# Patient Record
Sex: Female | Born: 1970 | Marital: Married
Health system: Western US, Academic
[De-identification: ages and names within clinical notes are randomized; demographics above are authoritative.]

---

## 2000-10-16 ENCOUNTER — Ambulatory Visit: Payer: Self-pay

## 2000-11-08 ENCOUNTER — Other Ambulatory Visit: Payer: Self-pay | Admitting: OBSTETRICS/GYN

## 2010-08-19 NOTE — Discharge Summary (Signed)
SERVICE: OB/GYN    DISCHARGE DIAGNOSES:    1. Vaginal birth after cesarean section.  2. Positive meconium, group B Streptococcus positive.  3. Status post bilateral tubal ligation.    HISTORY OF PRESENT ILLNESS:    This is a 40 year old G5, P4-0-0-4 with an EGA of 41 and 2 weeks, EDC of  10/30/00 confirmed by a 37 weeks ultrasound on 04/30. The patient  initially presented to Labor and Delivery with increased frequency of  uterine contractions and loss of fluid. No vaginal bleeding with positive  fetal movements, and no other complaints.    PAST MEDICAL HISTORY:    No medical or surgical history: Past medications: Prenatal vitamins.  ALLERGIES: NO KNOWN DRUG ALLERGIES.    OBSTETRICAL HISTORY:    Presented late to care at 34 weeks. She had a previous NSVD in 1991, 1993,  and 1994 and a primary C-section in 2000. These were all done in [place].    SOCIAL HISTORY: She has no drug use, no alcohol, and no tobacco.  FAMILY HISTORY: None.    INITIAL PHYSICAL EXAM:    Normocephalic, atraumatic. Heart was regular rate and rhythm. Lungs were  clear to auscultation. The fundus was term. Estimated fetal weight was 7  pounds. Fetal heart tracings were in the 130s. Initial exam showed  dilation of 4 cm, effacement of 80%, station was -2, membranes were intact.    MATERNAL LABS:    Blood type O+, antibody screen negative, hep B SAG nonreactive, rubella  immune. Hematocrit 35.9, postop hematocrit was 30.4, GBS was positive.  One-hour GTT was 95. RPR was nonreactive.    HOSPITAL COURSE:    The patient was admitted and consented for a vaginal birth after C-section.  The patient was AROM'd with intrauterine monitoring, and on 11/08/00 at  12:18, the patient delivered via a normal spontaneous vaginal delivery a  viable infant with Apgars 8 and 8. There was some difficulty delivering  the anterior shoulder requiring McRoberts maneuver and suprapubic pressure.  The infant also had thin meconium and GBS positive, so ampicillin was  given.  The patient was taken over to Postpartum, and her postpartum course  was essentially unremarkable. On postpartum day one, the patient had a  bilateral tubal ligation without any complications. On postpartum day #2,  the patient was ambulating and voiding and taking good p.o.'s with minimal  vaginal bleeding and pain at the time of discharge.    DISCHARGE MEDICATIONS:    Motrin 600 mg p.o. t.i.d.; Colace 100 mg p.o. b.i.d.; iron p.o. b.i.d.    FOLLOWUP: [facility] weeks.      ACTIVITY:    The patient is instructed to have nothing in the vagina for four to six  weeks (no tampons, douching or sex). In addition, if the patient  experiences a temperature or vaginal bleeding greater than one pad per hour  for three or four hours, the patient is instructed to call an M.D. or  return to the ER, and all of this was reviewed with the help of an  interpreter, as the patient speaks Spanish only.    CONDITION ON DISCHARGE: Stable.        DICTATED BY:  Nicolasa Ducking, M.D.  [facility]  [id number]    Signature and date on the line below this statement indicates that I have  made the corrections/completed the blanks/added the handwritten information  on the date entered below.      Signature/Author of ChangesDate of  Changes    D: 11/10/2000 12:21 P  T: 11/13/2000 5:18 P    C#: [id number]

## 2012-06-14 IMAGING — MG MAMMO SCRN BIL W/CAD
4 series · 4 of 4 positions shown · non-contrast
Comparison: none

Images Obtained from [HOSPITAL] Imaging
CLINICAL RA REF: Mammo Scrn Bil (Digital) W/ Cad Routine screening. Mother with postmenopausal breast cancer.
Comparison is made to exams dated:  03/23/2011 [HOSPITAL] [facility], 02/17/2010, and 09/02/2008 [HOSPITAL] [facility].
There are scattered fibroglandular elements in both breasts.
Current study was also evaluated with a Computer Aided Detection (CAD) system.
There are benign calcifications in the right breast.
No significant masses, calcifications, or other findings are seen in either breast.
There has been no significant interval change.

[R CC]
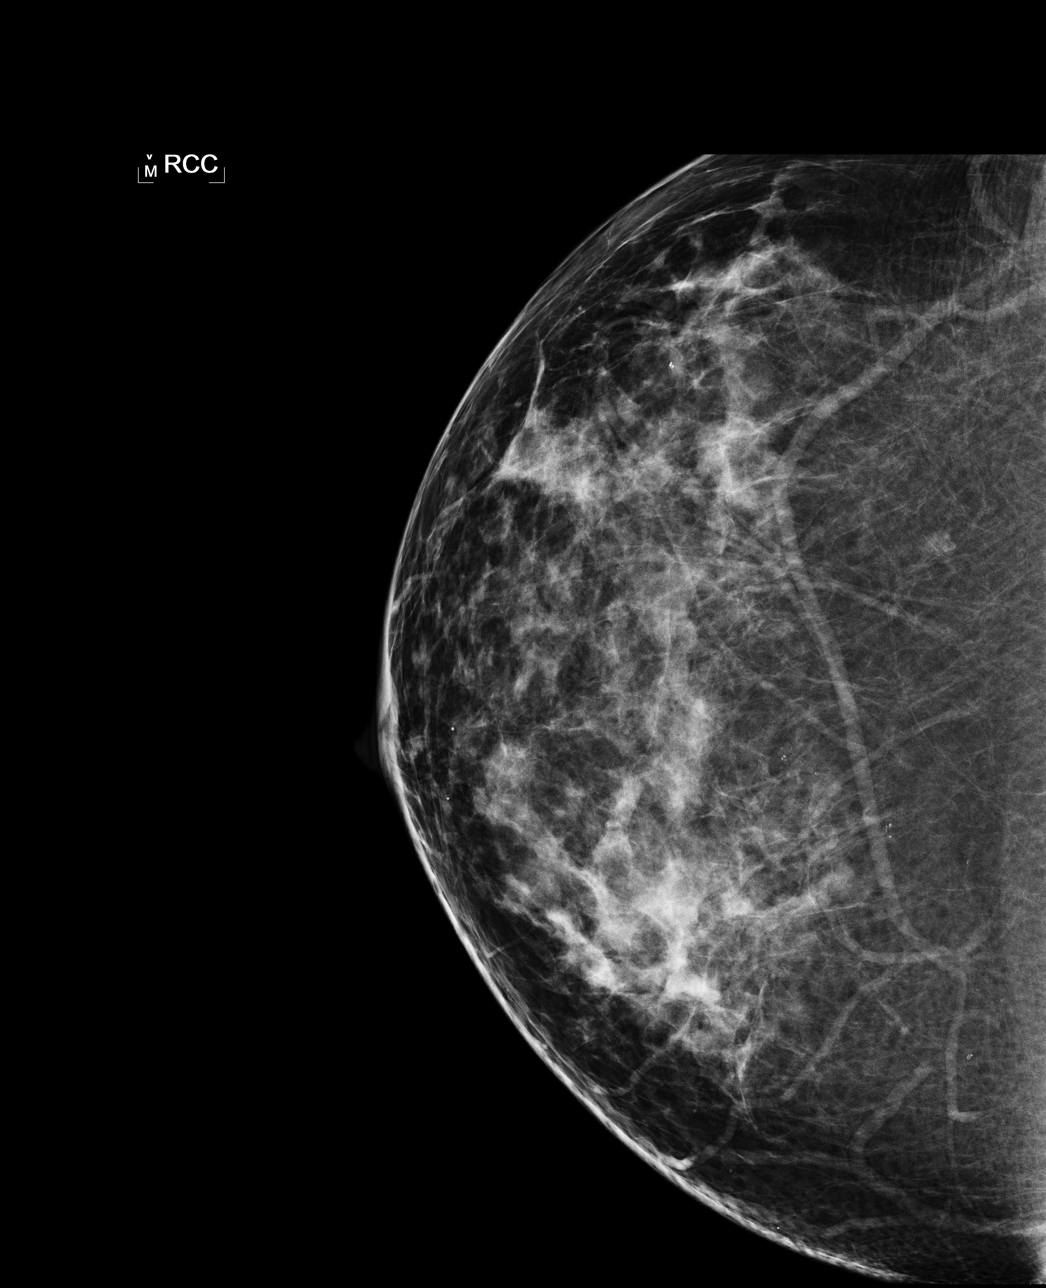

[L CC]
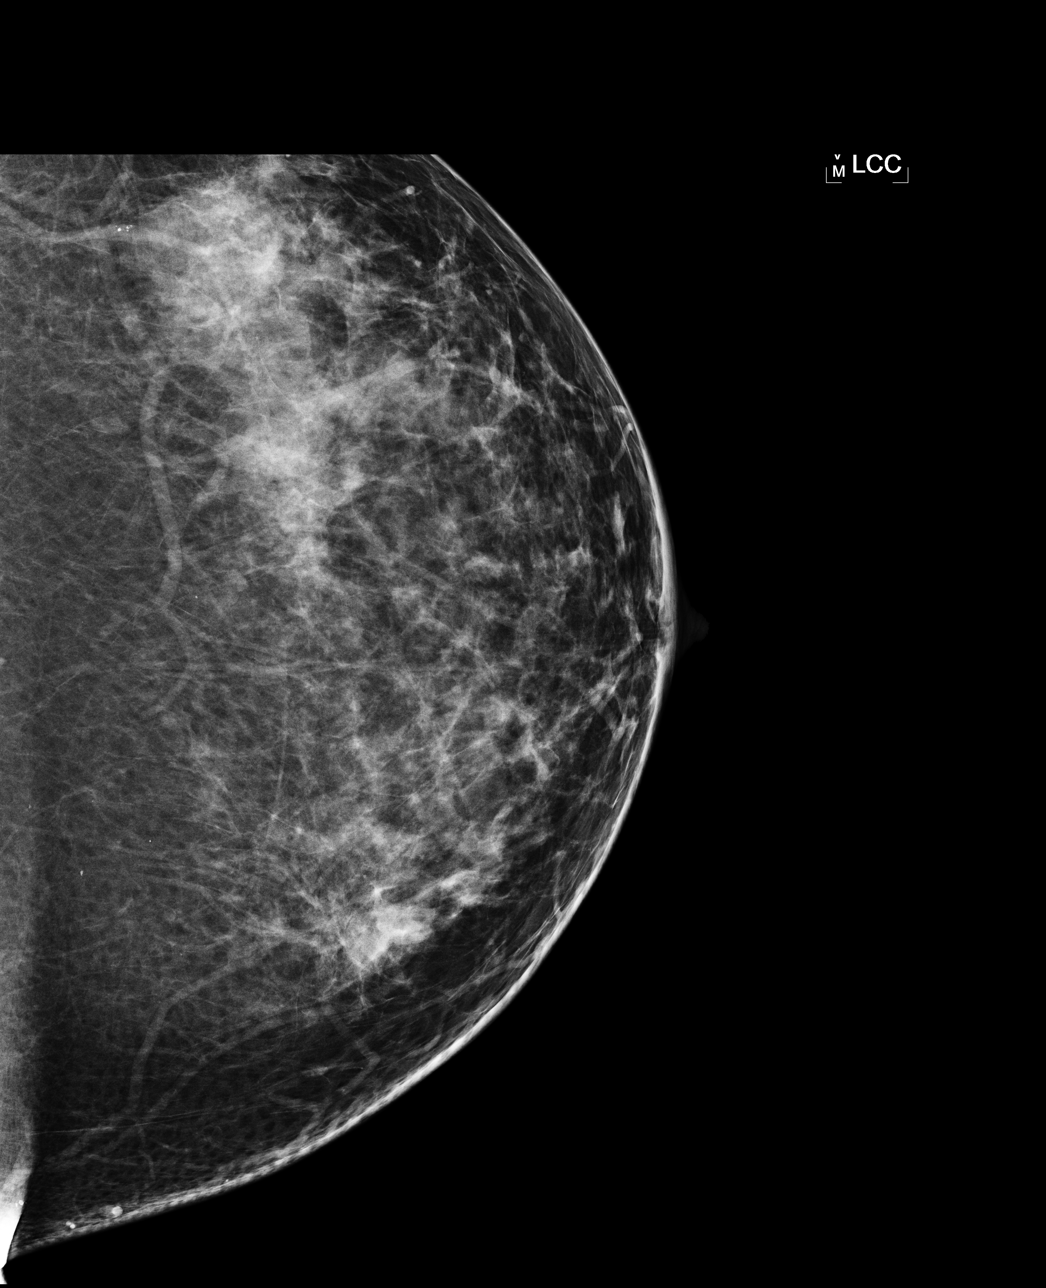

[R MLO]
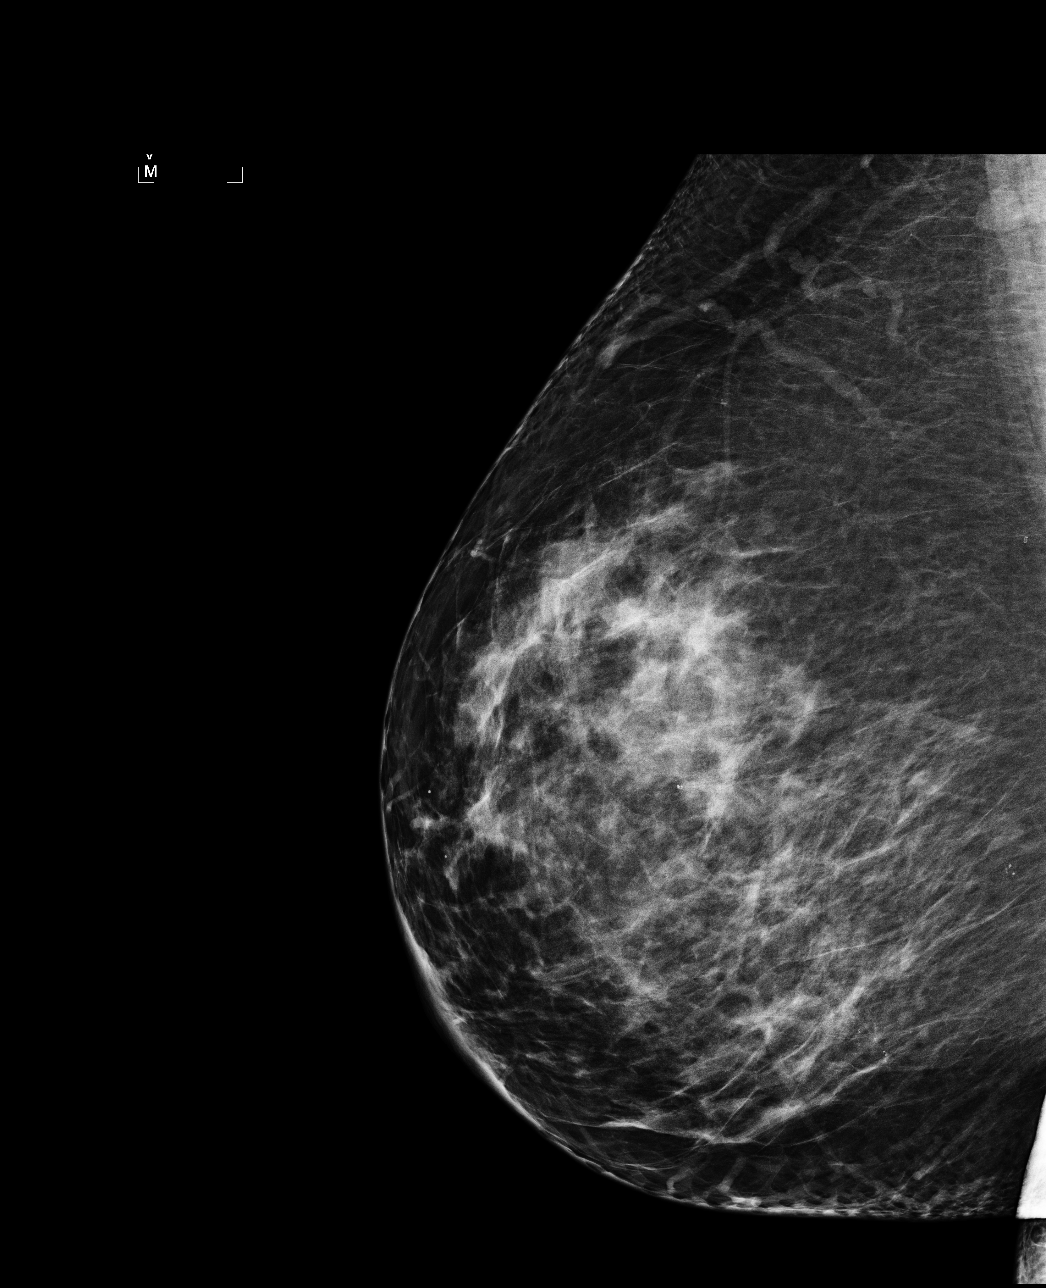

[L MLO]
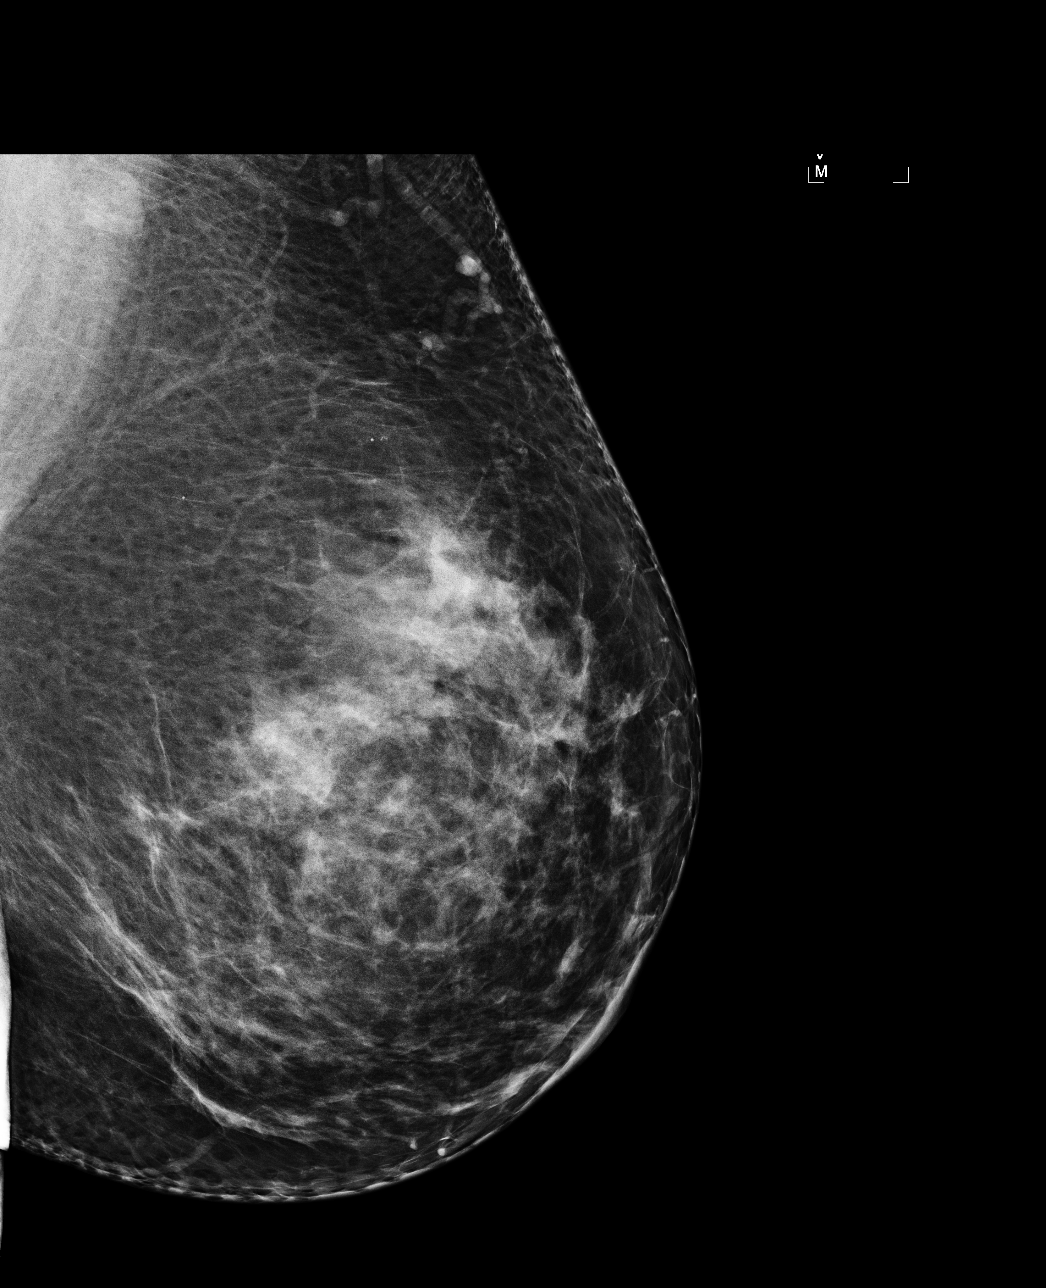

[4 of 4 positions shown; findings below may reference images not displayed]

IMPRESSION: There is no mammographic evidence of malignancy.  A 1 year screening mammogram is recommended.
Mariko/Odette:06/16/2012 [DATE]
letter sent: Normal
Mammogram BI-RADS: 2 Benign   [id number] v95.66 v97.2

## 2014-02-24 IMAGING — MG MAMMO SCRN BIL W/CAD TOMO
8 series · 8 of 24 positions shown · non-contrast
Comparison: none

Images Obtained from [facility]
CLINICAL RA REF: Mammo Scrn bilateral (Digital) W/Cad Routine with Tomosynthesis. Family history of breast cancer:  mother at 55 y.o.
Digital images were generated from the 3D Tomosynthesis data acquired during the exam.
Comparison is made to exam dated:  06/14/2012 [HOSPITAL] - [HOSPITAL].
There are scattered fibroglandular elements in both breasts.
Current study was also evaluated with a Computer Aided Detection (CAD) system.
No significant abnormalities are seen in either breast.
There has been no significant interval change.

[R MLO]
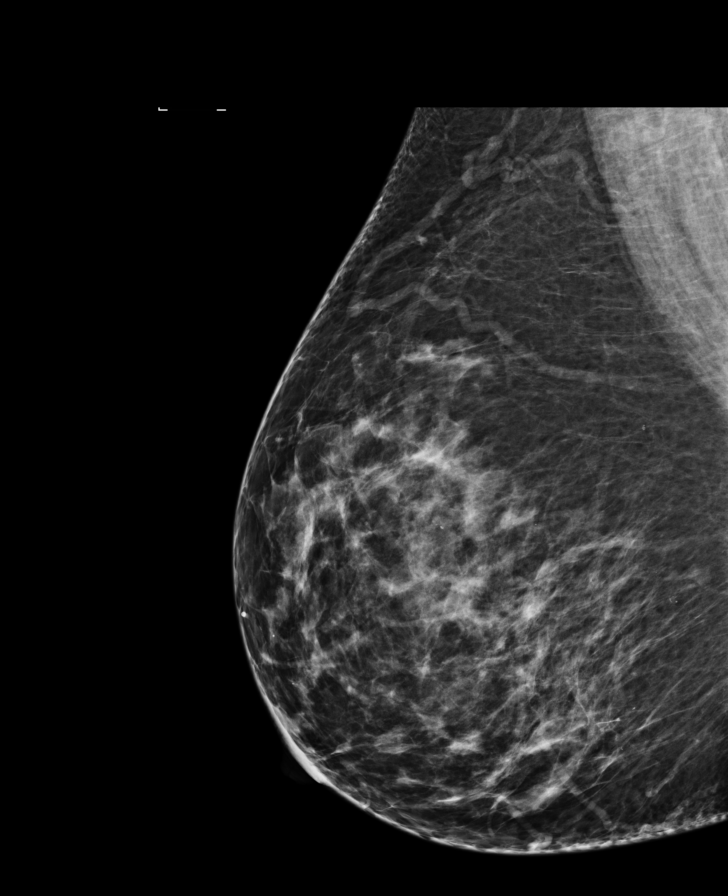

[L MLO]
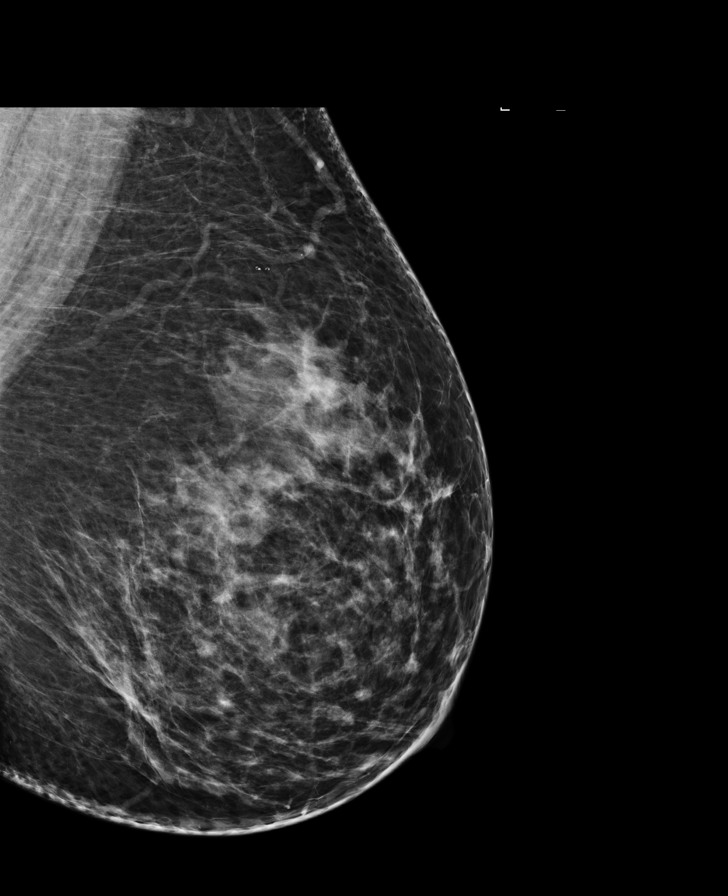

[R CC]
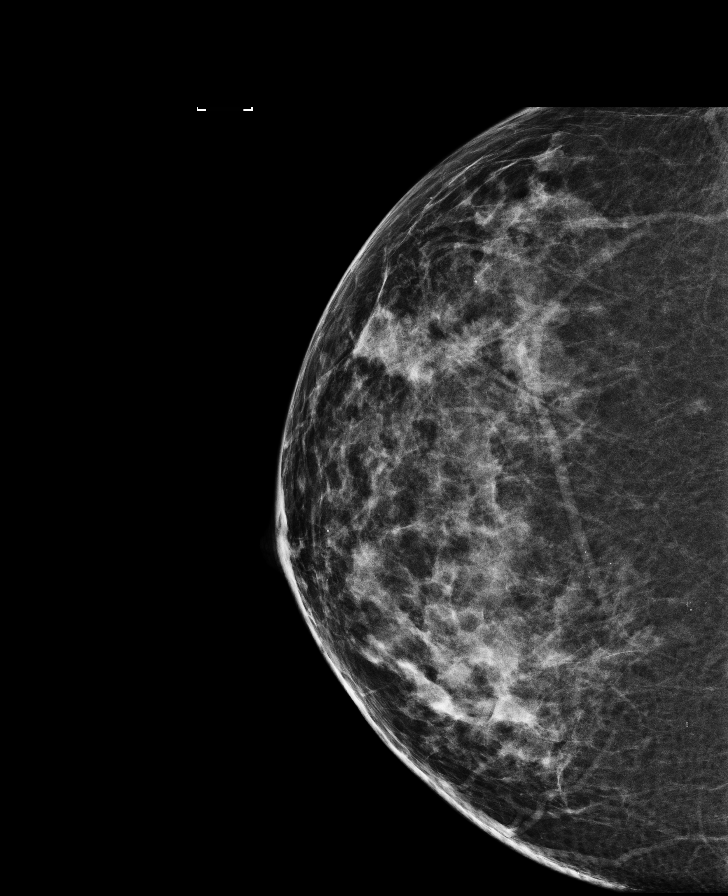

[L CC]
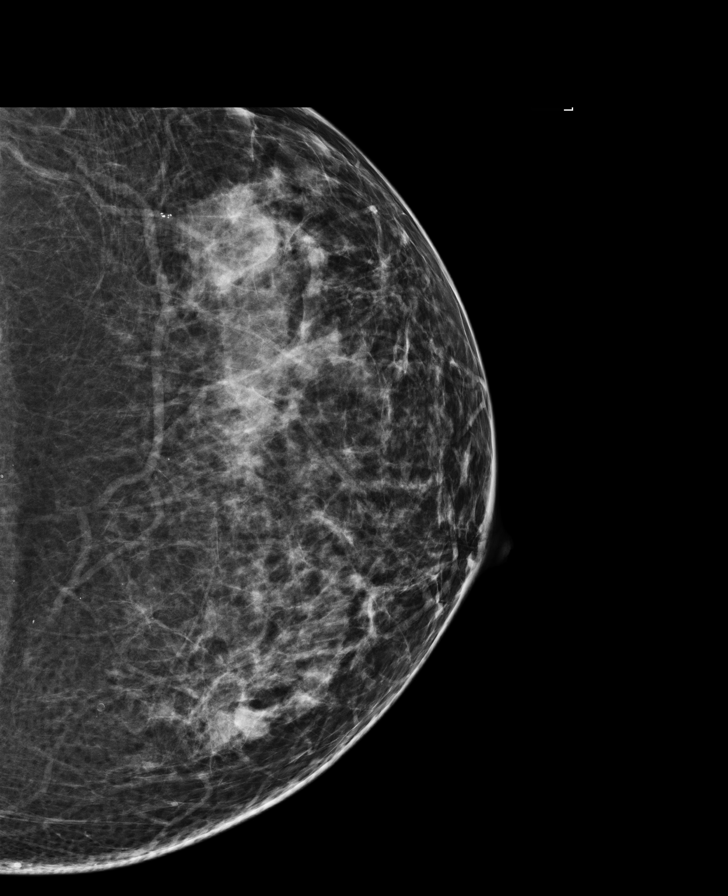

[L MLO tomo · tomo slice 53/105.0]
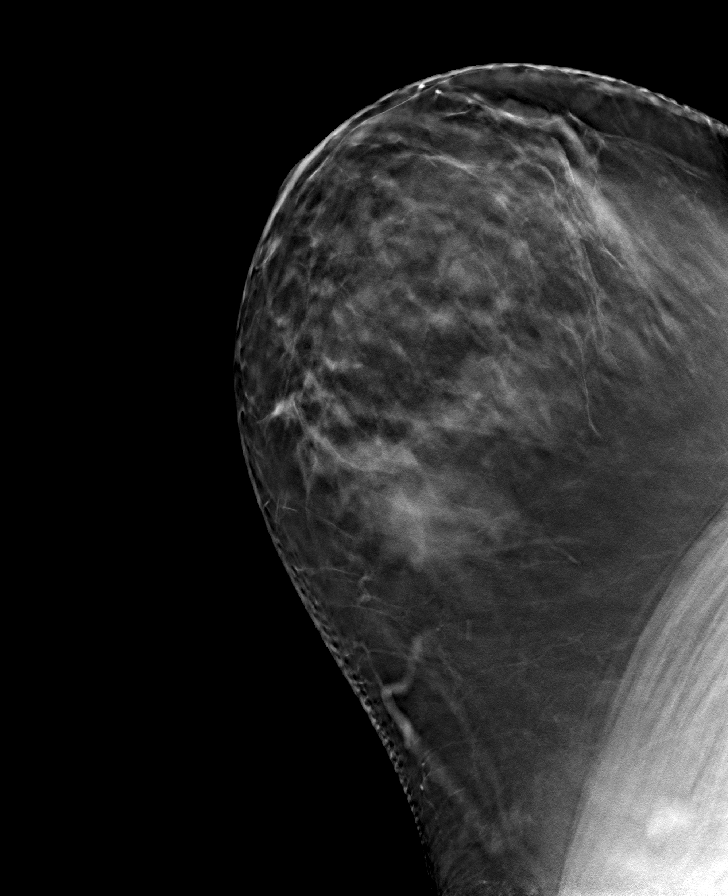

[R CC tomo · tomo slice 45/88.0]
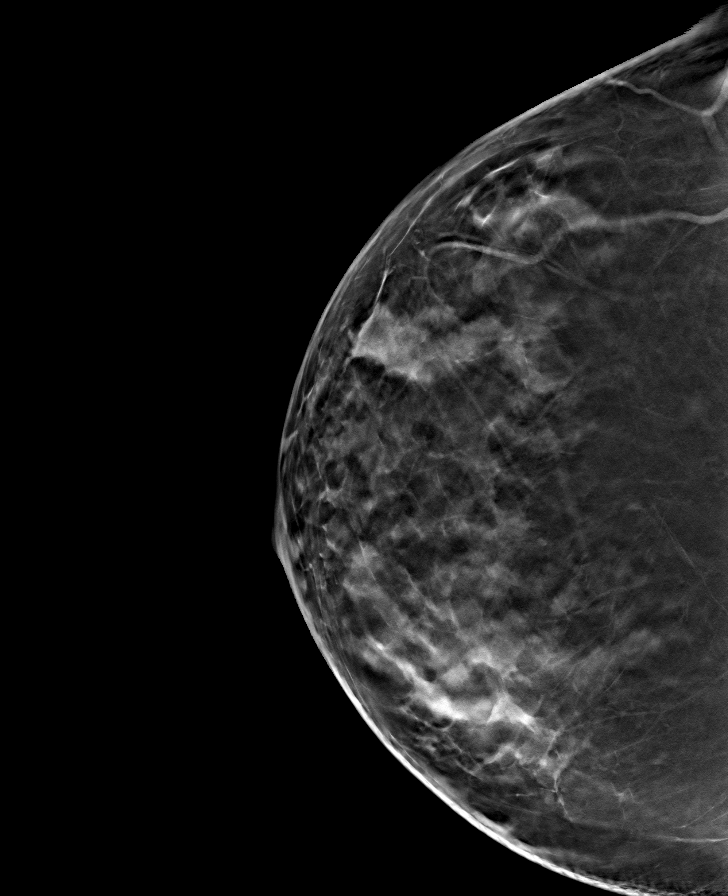

[L CC tomo · tomo slice 45/88.0]
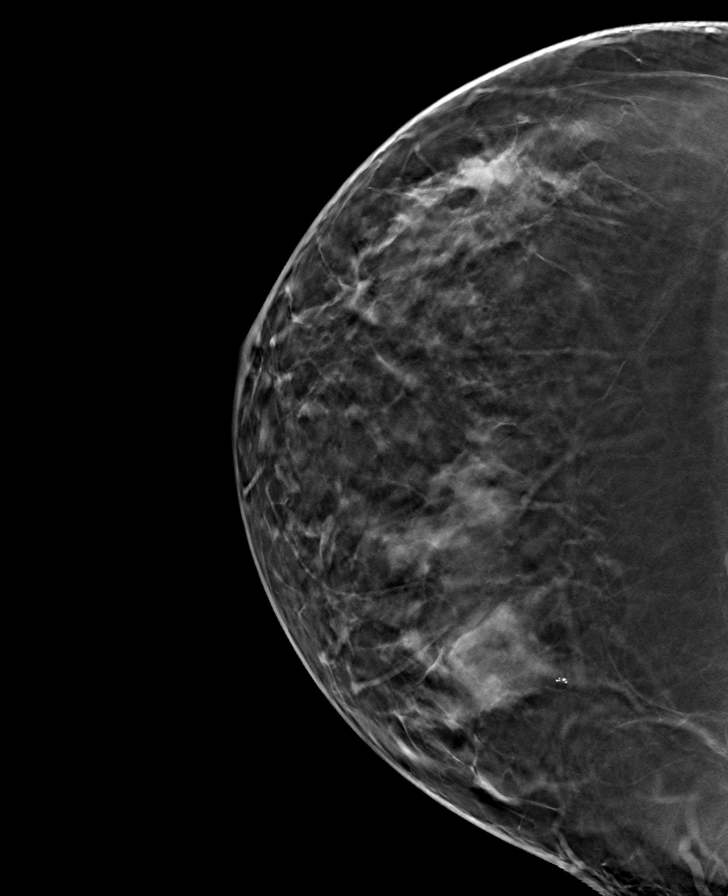

[R MLO tomo · tomo slice 51/102.0]
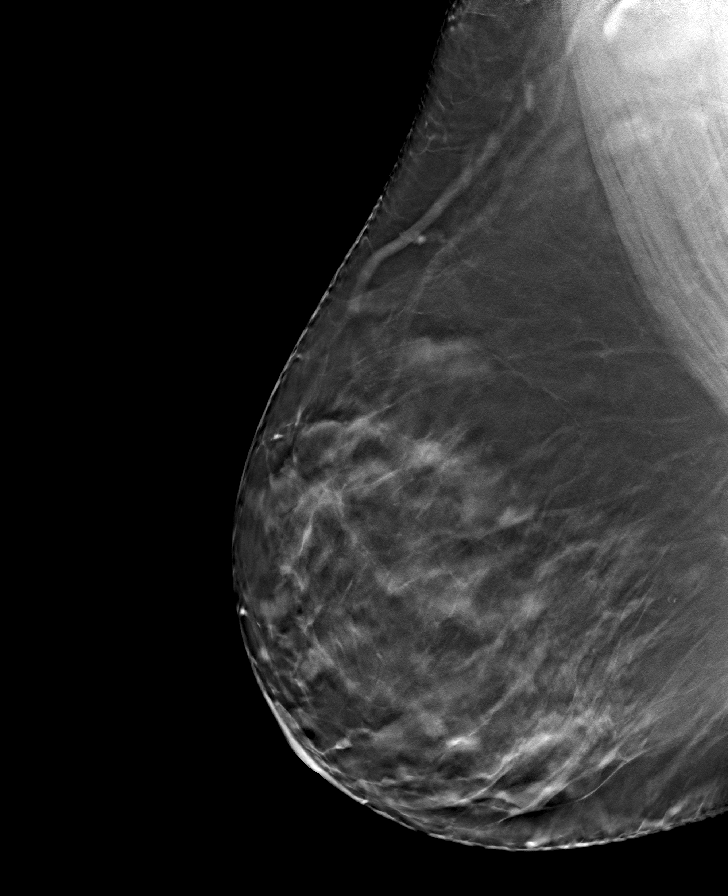

[8 of 24 positions shown; findings below may reference images not displayed]

IMPRESSION: There is no mammographic evidence of malignancy. A 1 year screening mammogram is recommended.
mwm/penrad:02/25/2014 [DATE]
letter sent: Normal
Mammogram BI-RADS: 2 Benign   BUXUX v37.33 v26.5

## 2015-06-14 IMAGING — MG MAMMO SCRN BIL W/CAD TOMO
8 series · 8 of 24 positions shown · non-contrast
Comparison: none

Images Obtained from [facility]
CLINICAL RA REF: Mammo Scrn bilateral (Digital) W/Cad Routine with Tomosynthesis. Mother with postmenopausal breast cancer age 55.
Digital images were generated from the 3D Tomosynthesis data acquired during the exam.
Comparison is made to exams dated:  02/24/2014 [HOSPITAL] - [HOSPITAL], 06/14/2012 [HOSPITAL] - [HOSPITAL], and 03/23/2011 [HOSPITAL] -
[HOSPITAL].
There are scattered fibroglandular elements in both breasts.
Current study was also evaluated with a Computer Aided Detection (CAD) system.
No significant masses, calcifications, or other findings are seen in either breast.
There has been no significant interval change.

[L MLO]
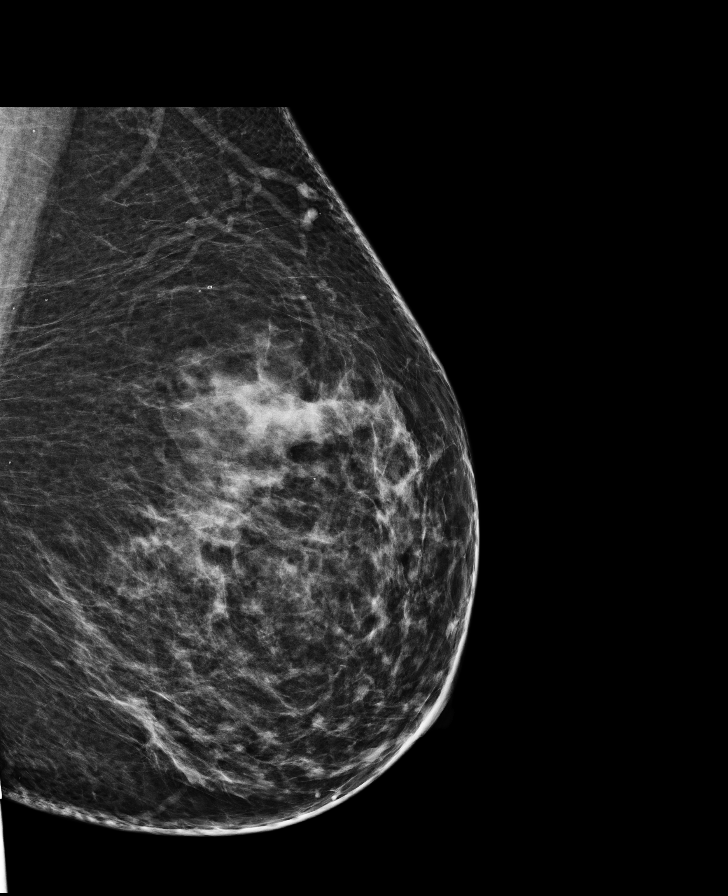

[R MLO]
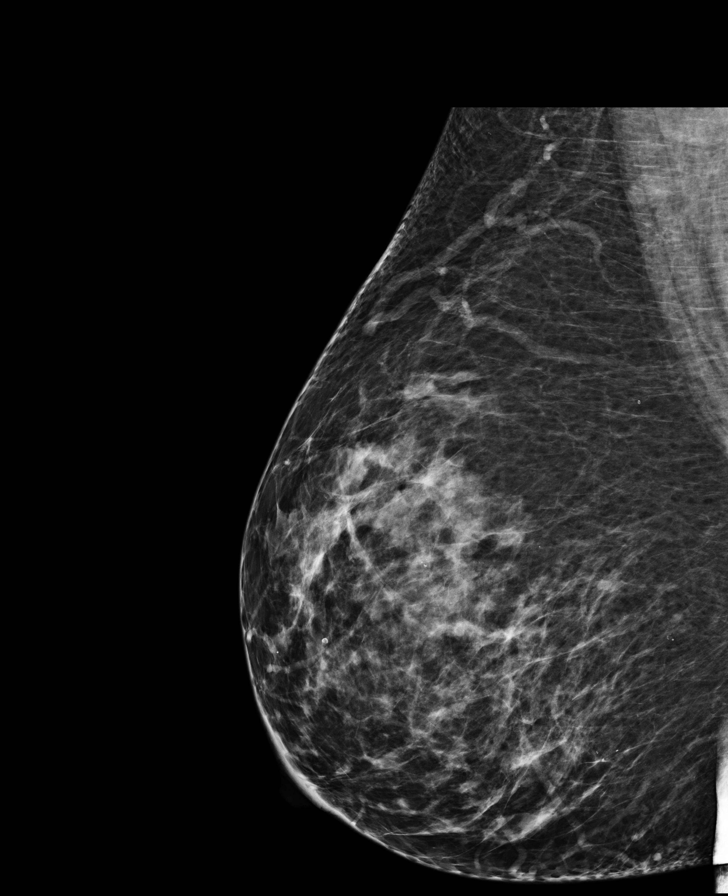

[L CC]
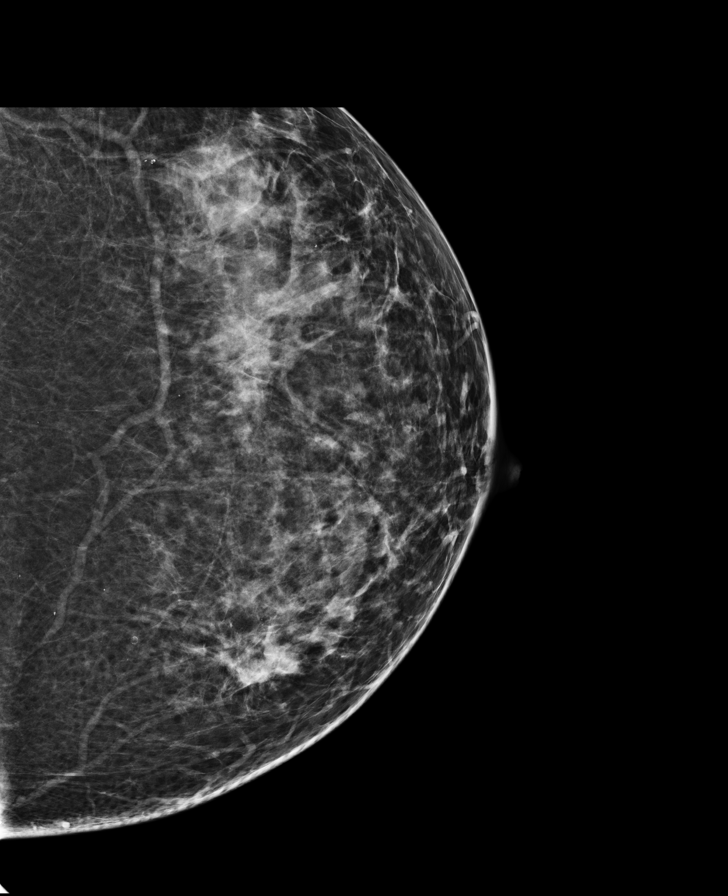

[R CC]
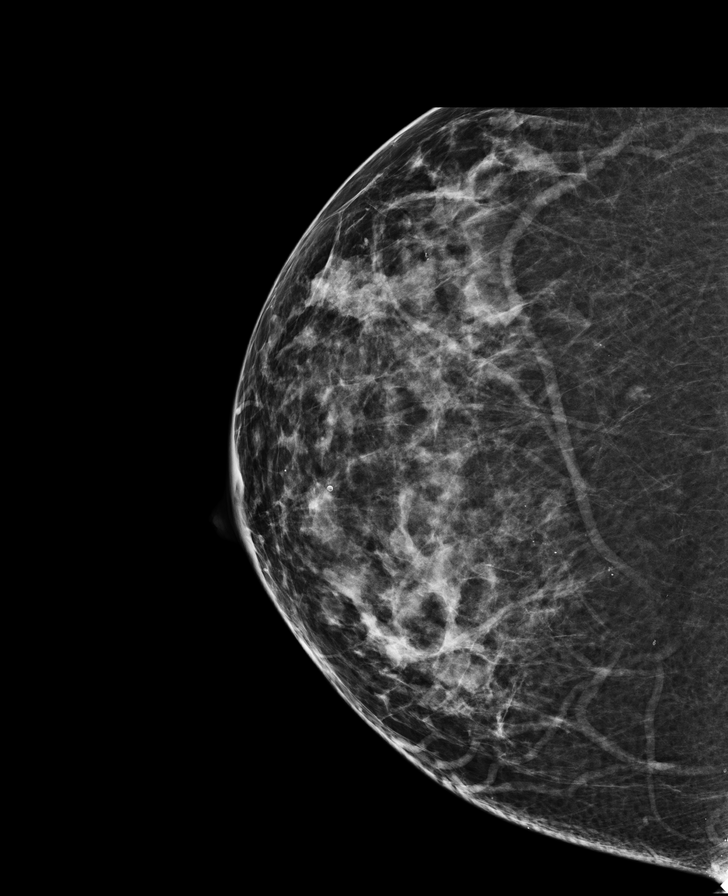

[R CC tomo · tomo slice 39/78.0]
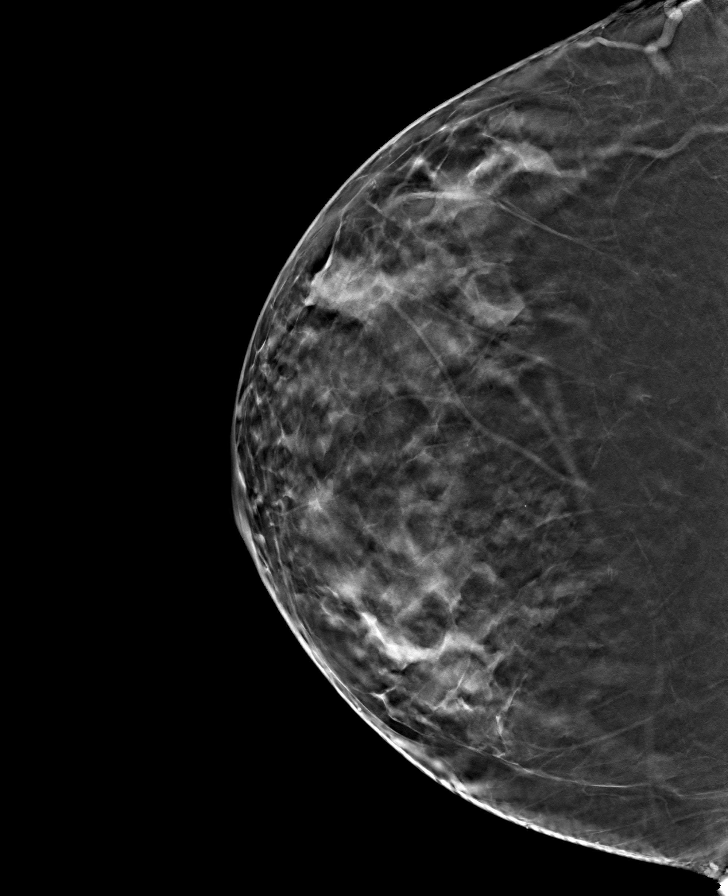

[L MLO tomo · tomo slice 45/89.0]
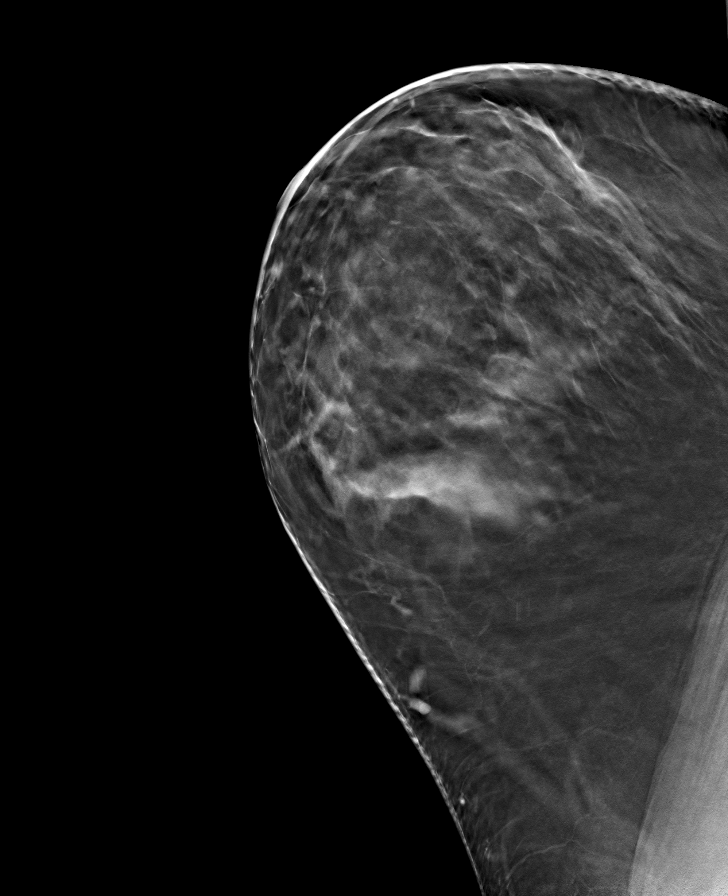

[L CC tomo · tomo slice 39/78.0]
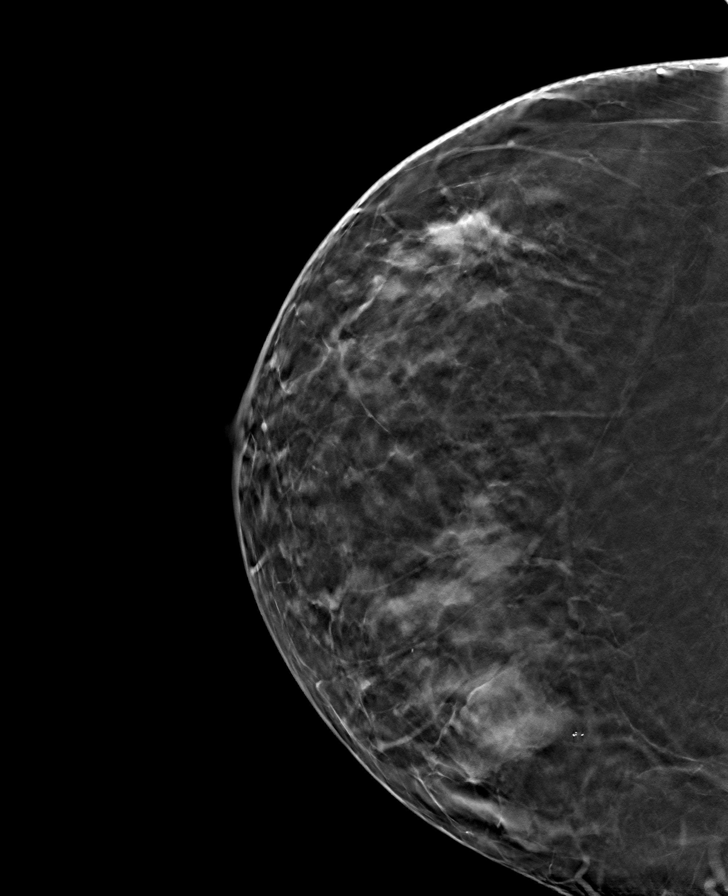

[R MLO tomo · tomo slice 47/94.0]
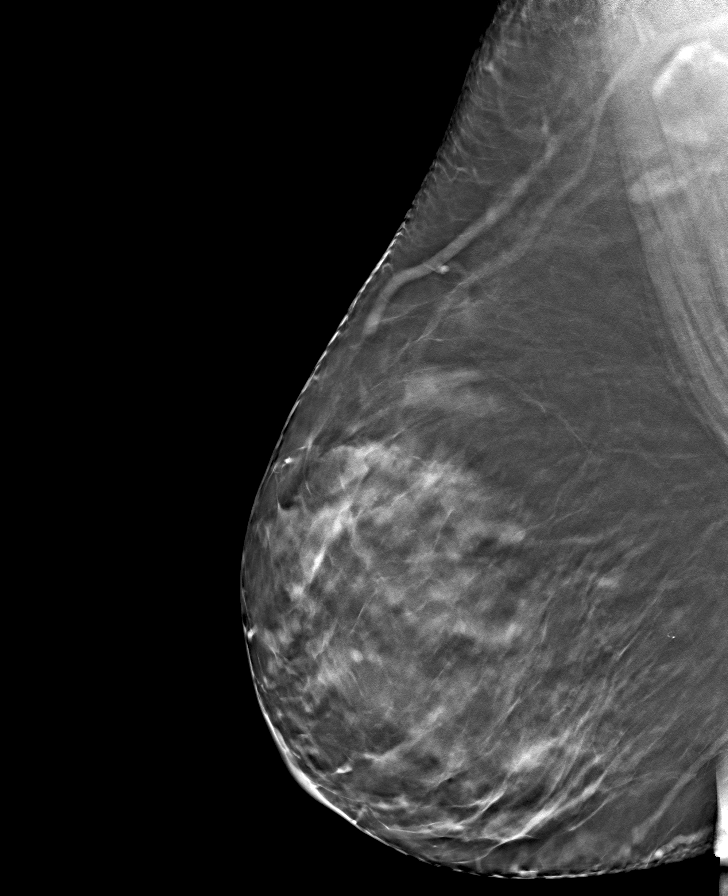

[8 of 24 positions shown; findings below may reference images not displayed]

IMPRESSION: There is no mammographic evidence of malignancy. A 1 year screening mammogram is recommended.
Deon/Monika Dominik:06/15/2015 [DATE]
letter sent: Normal
Mammogram BI-RADS: 1 Negative

## 2016-09-15 IMAGING — MG MAMMO SCRN BIL W/CAD TOMO
8 series · 8 of 24 positions shown · non-contrast
Comparison: none

Images Obtained from [facility]
CLINICAL RA REF: Mammo Scrn bilateral (Digital) W/Cad Routine with Tomosynthesis.
Digital images were generated from the 3D Tomosynthesis data acquired during the exam.
Comparison is made to exams dated:  06/14/2015 [facility] - [HOSPITAL], 02/24/2014 [HOSPITAL] - [HOSPITAL], and 06/14/2012 [facility]OSPITAL[facility]
The breasts are heterogeneously dense, which may obscure small masses.
Current study was also evaluated with a Computer Aided Detection (CAD) system.
No significant abnormalities are seen in either breast.
There has been no significant interval change.
Your patient's mammogram demonstrates that she has dense breast tissue, which could hide small abnormalities.  In compliance with [place] the patient has been sent a letter which informs
her that she has dense breast tissue and might benefit from supplementary screening tests depending on her individual risk factors.  The patient may contact you if she has any questions or concerns.

[L MLO]
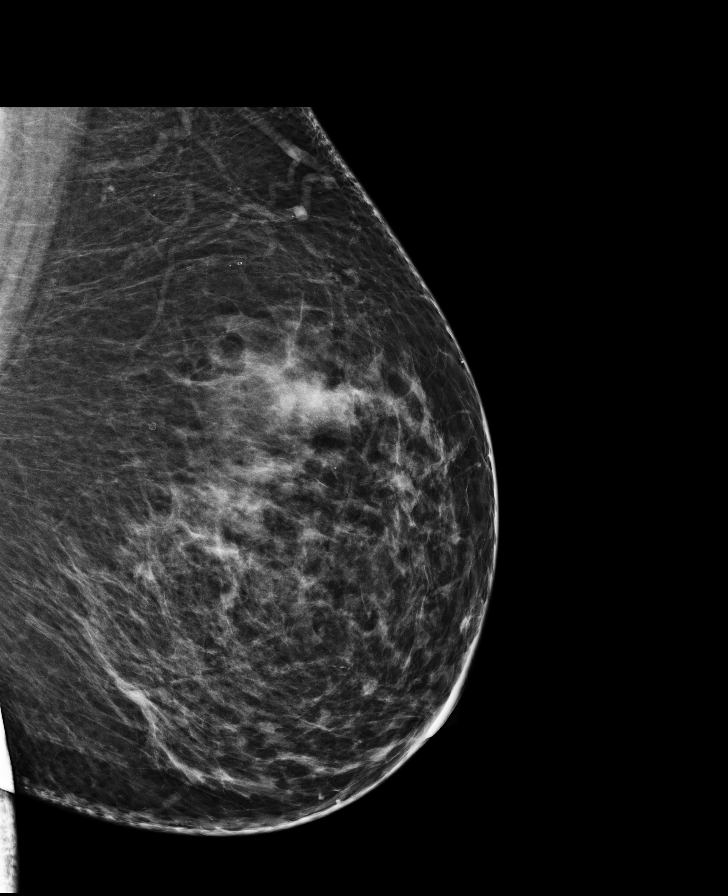

[R CC]
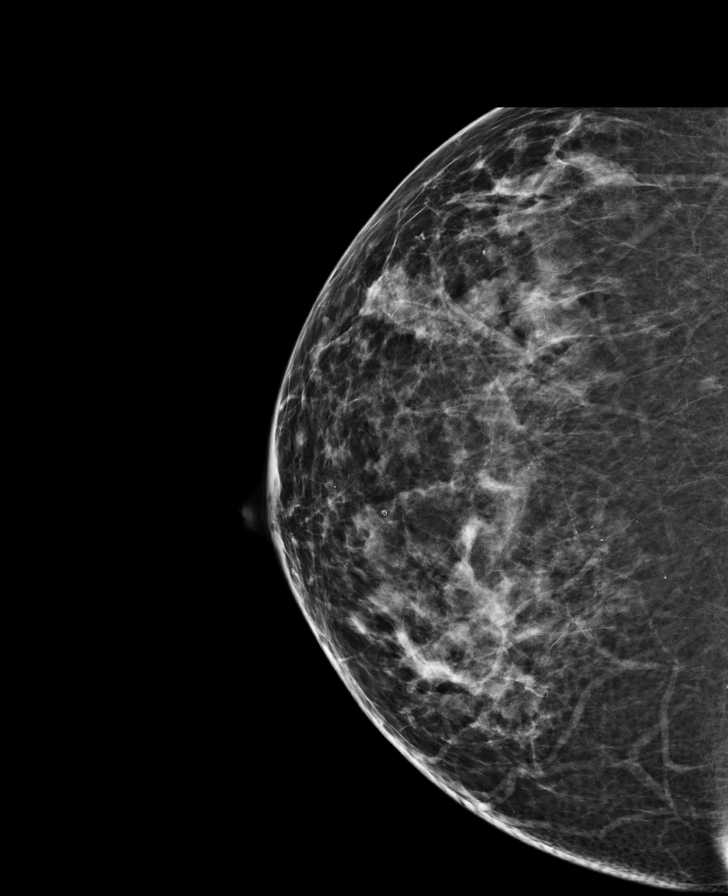

[R MLO]
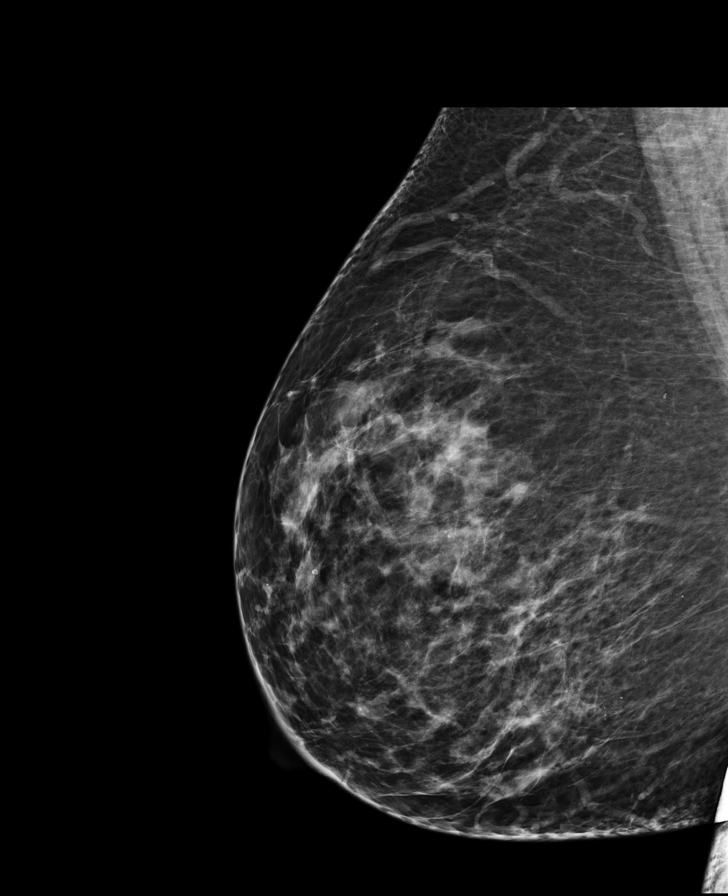

[L CC]
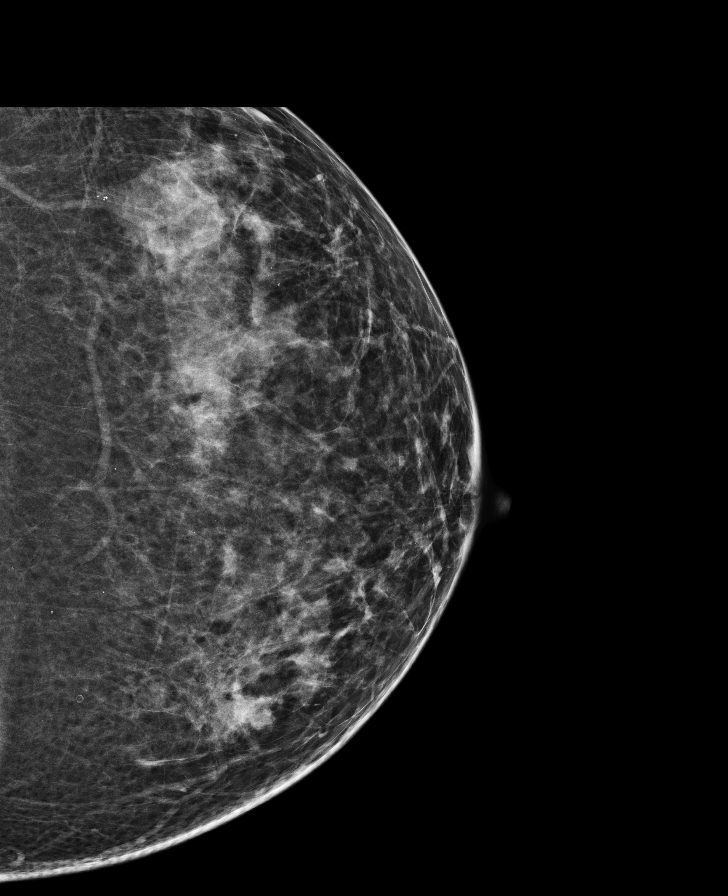

[L CC tomo · tomo slice 43/86.0]
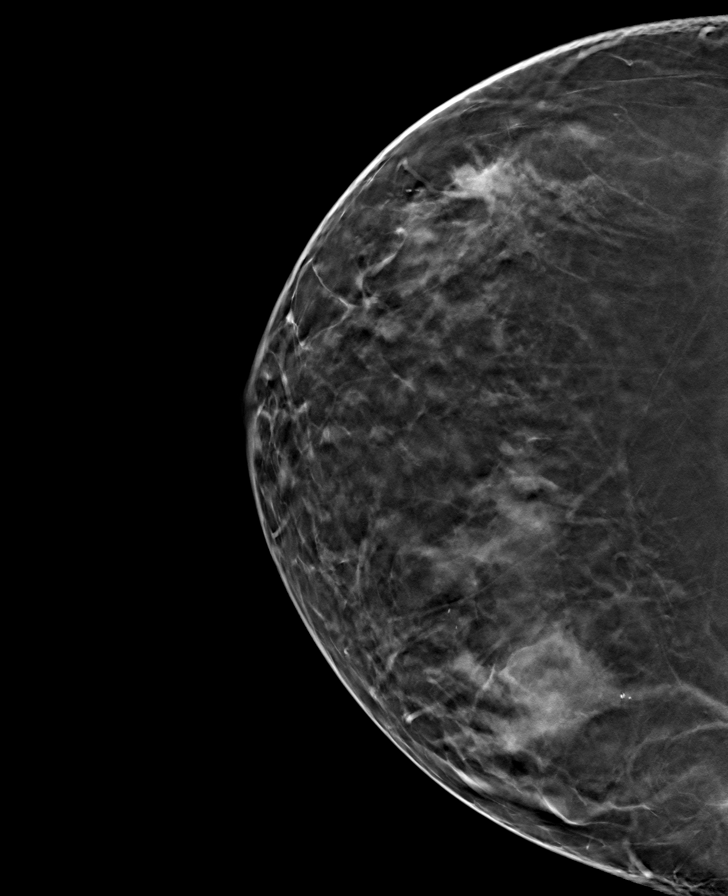

[R MLO tomo · tomo slice 51/102.0]
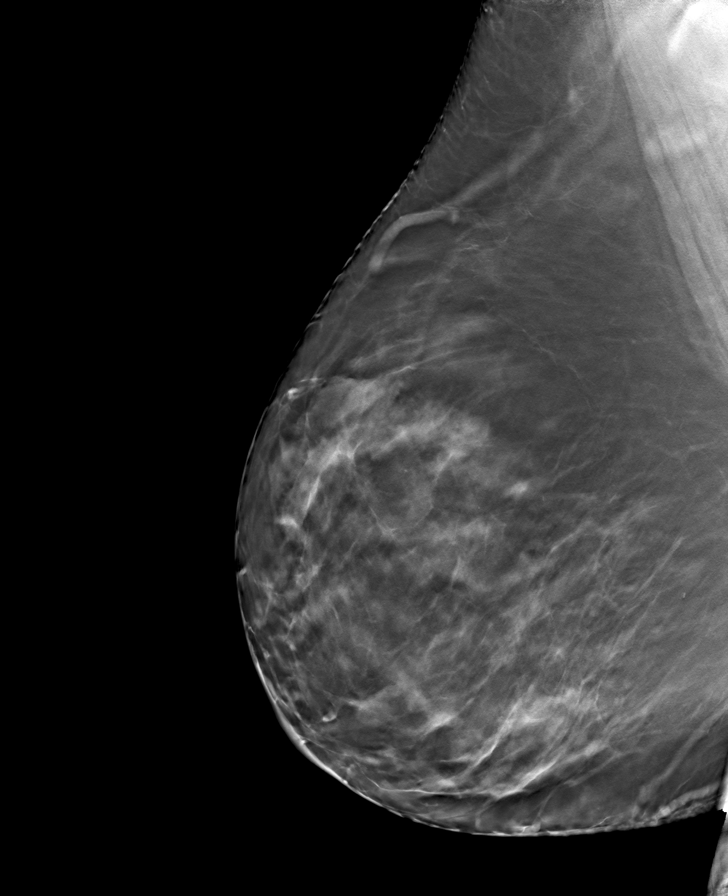

[L MLO tomo · tomo slice 52/103.0]
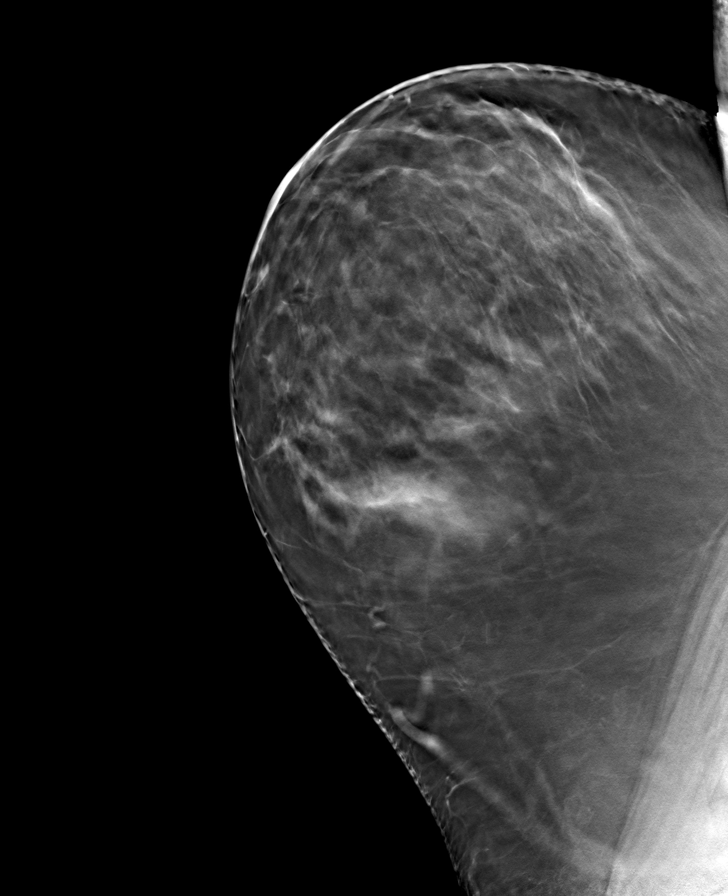

[R CC tomo · tomo slice 43/84.0]
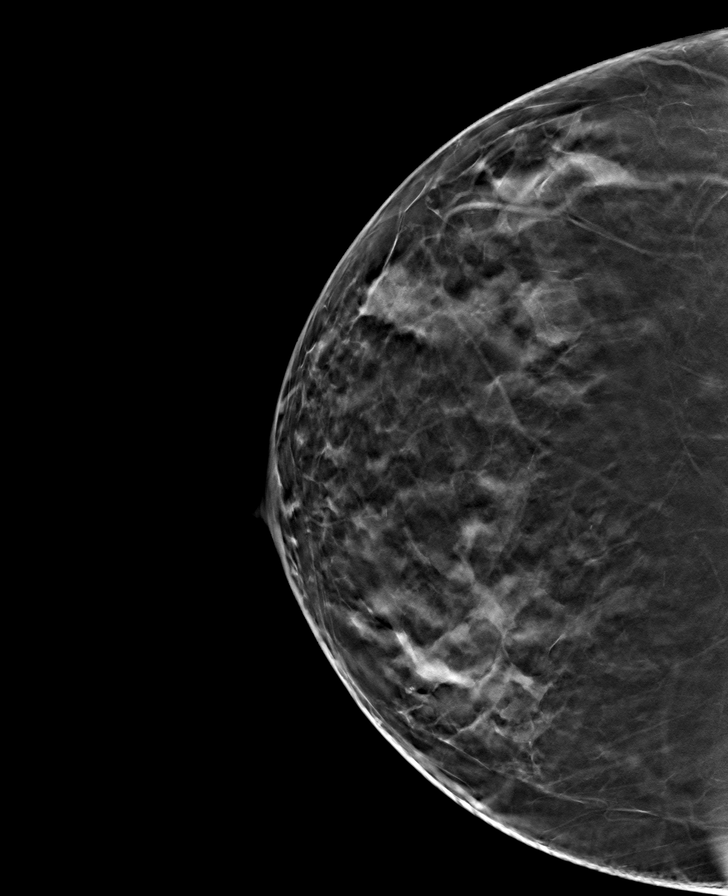

[8 of 24 positions shown; findings below may reference images not displayed]

IMPRESSION: BENIGN
There is no mammographic evidence of malignancy. A 1 year screening mammogram is recommended.
mwm/penrad:09/17/2016 [DATE]
letter sent: Normal
FINAL ASSESSMENT: BI-RADS:Category 2: Benign

## 2019-02-04 IMAGING — CT CT THORAX WO CONTRAST
2 of 3 series · 15 of 36 positions shown, 18 images · non-contrast
Comparison: Chest x-ray 02/04/2019

HISTORY: Shortness of breath
TECHNIQUE: Axial images of the chest were obtained from the lung apices through the lung bases without intravenous contrast. Sagittal and coronal reformatted images were generated from the original data set.

[Series 2: soft tissue · axial · 0.91mm/px · z∈[-326,-61]mm · 12 of 63 slices shown, 15 images]
[im 5/63  mediastinal]
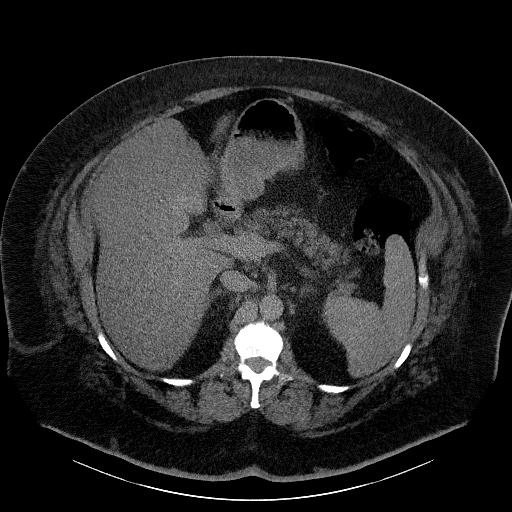
[im 5/63  lung]
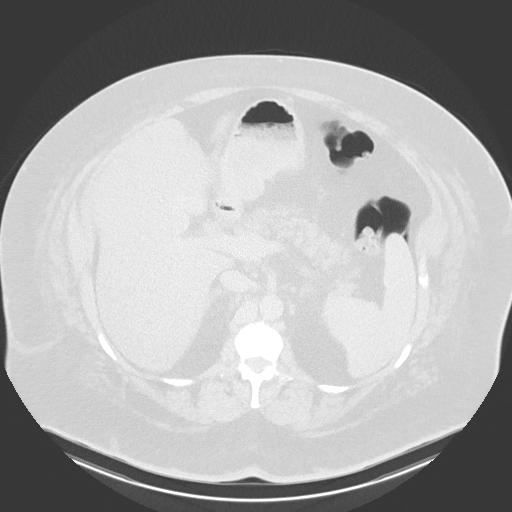
[im 10/63  lung]
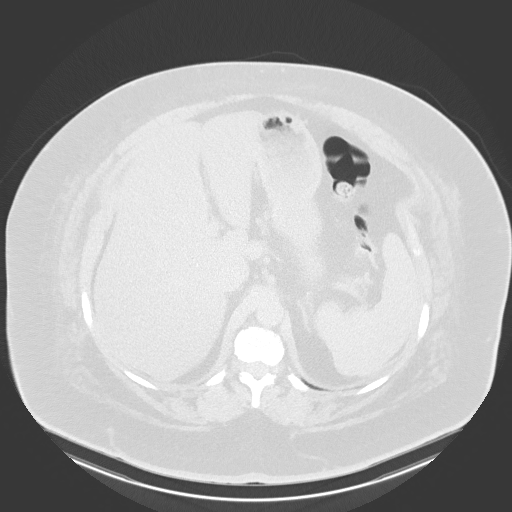
[im 14/63  lung]
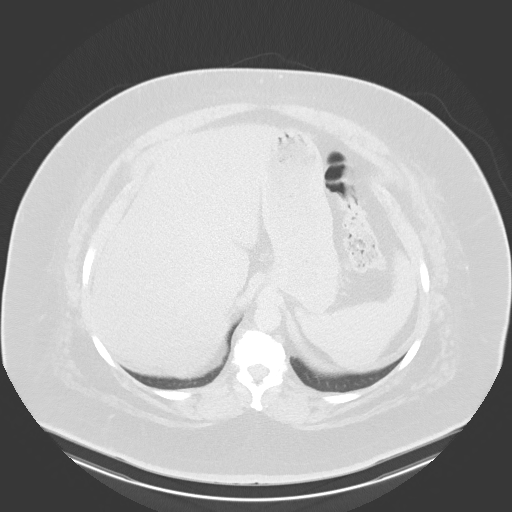
[im 19/63  lung]
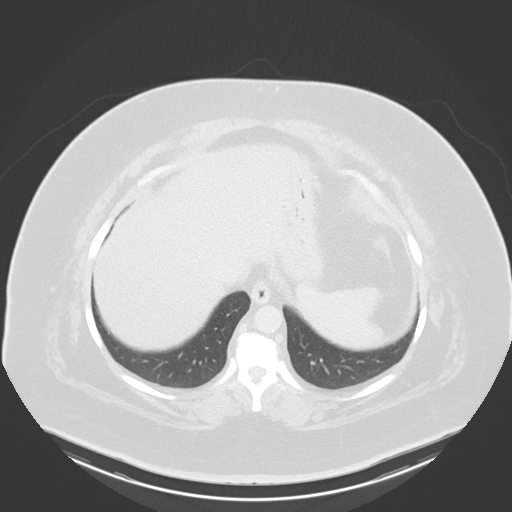
[im 23/63  mediastinal]
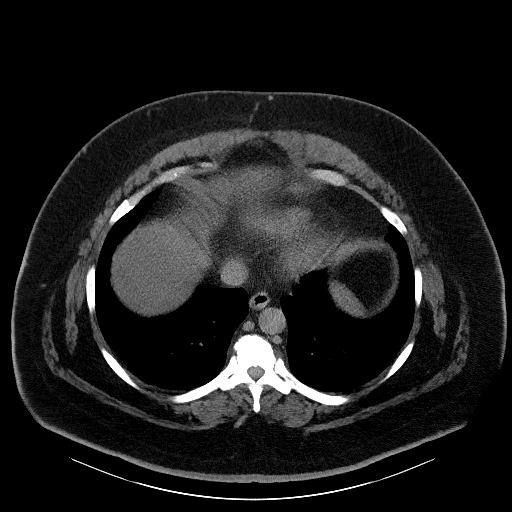
[im 23/63  lung]
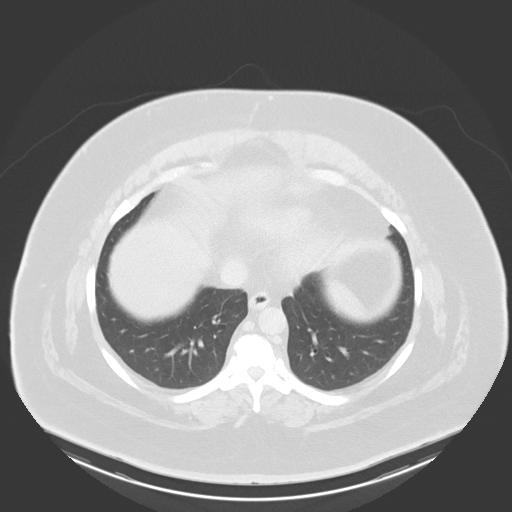
[im 28/63  lung]
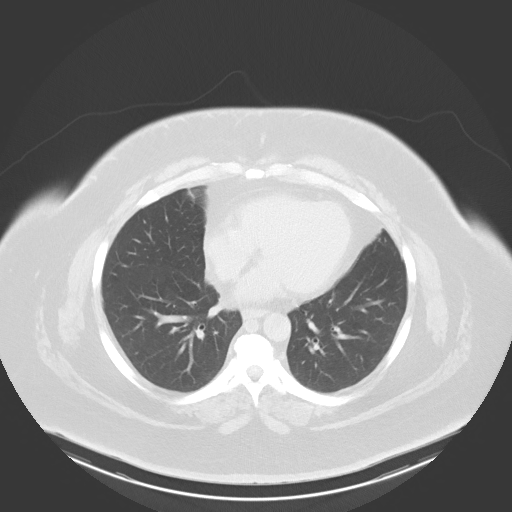
[im 35/63  lung]
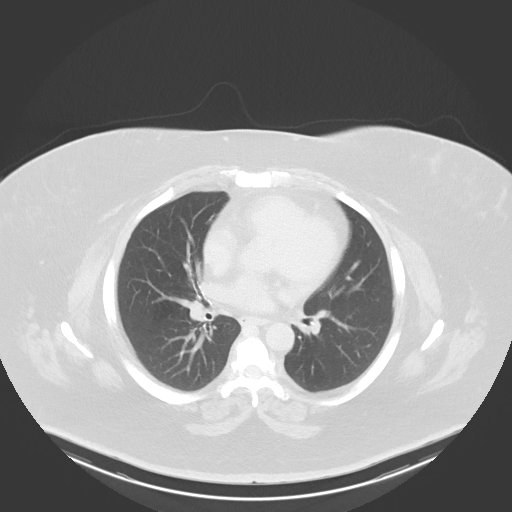
[im 40/63  lung]
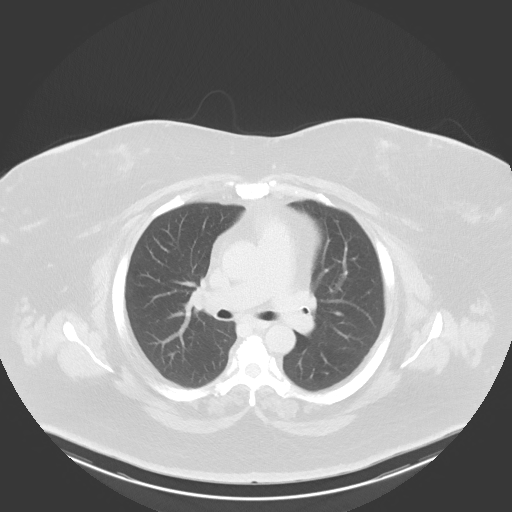
[im 44/63  mediastinal]
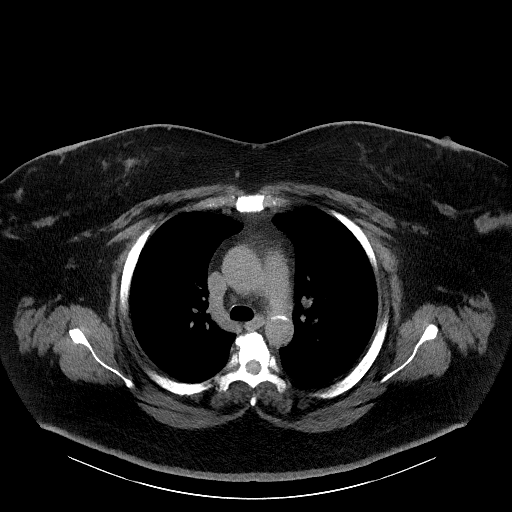
[im 44/63  lung]
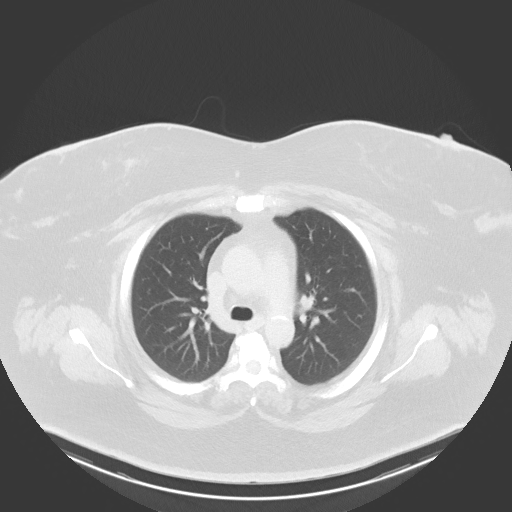
[im 49/63  lung]
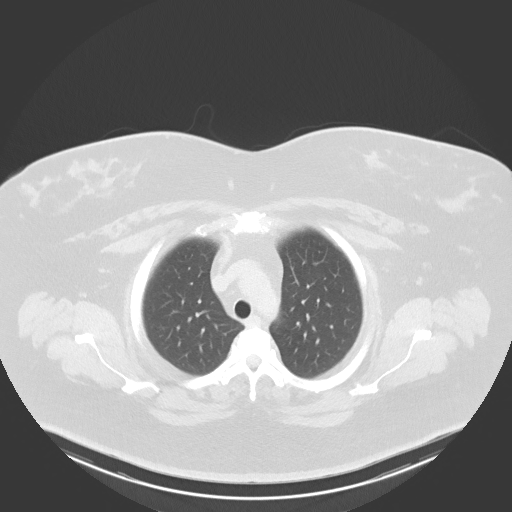
[im 53/63  lung]
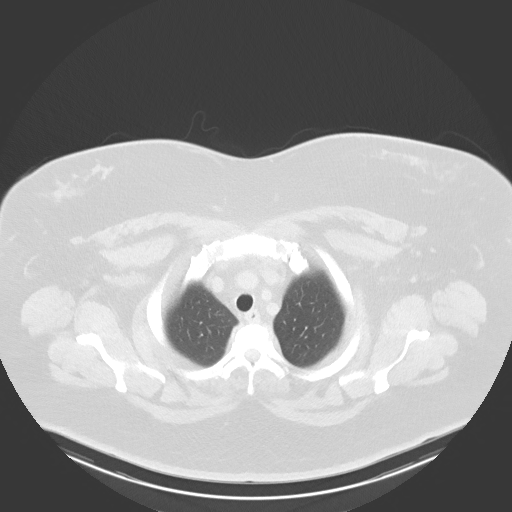
[im 58/63  lung]
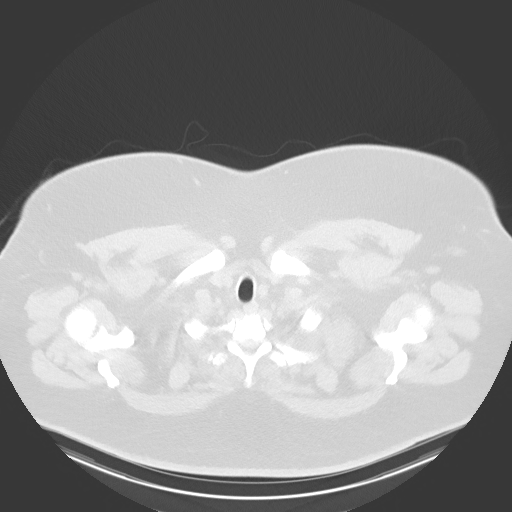

[Series 4: coronal · coronal · 0.65mm/px · 3 of 58 slices shown]
[im 12/58  lung]
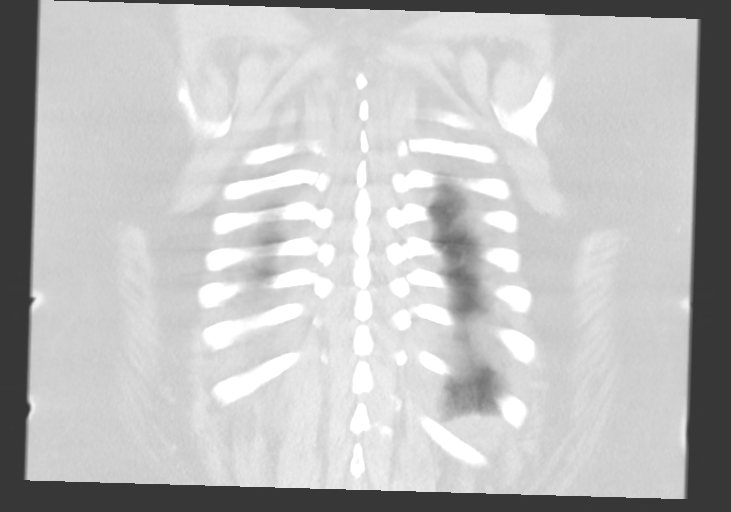
[im 23/58  lung]
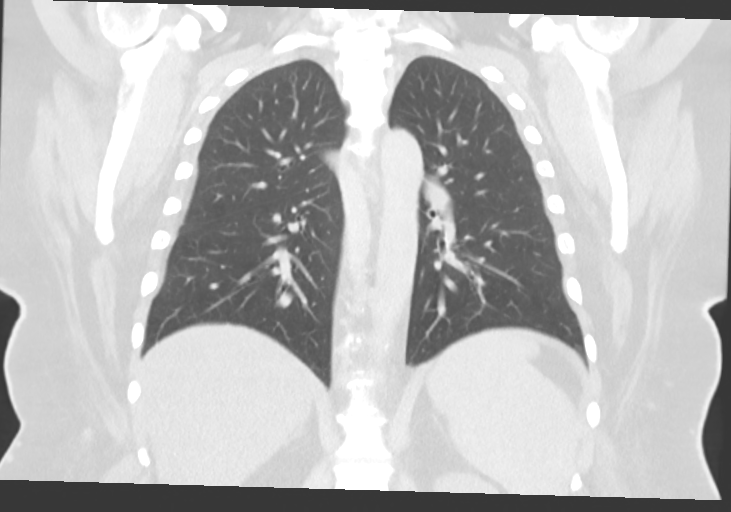
[im 35/58  lung]
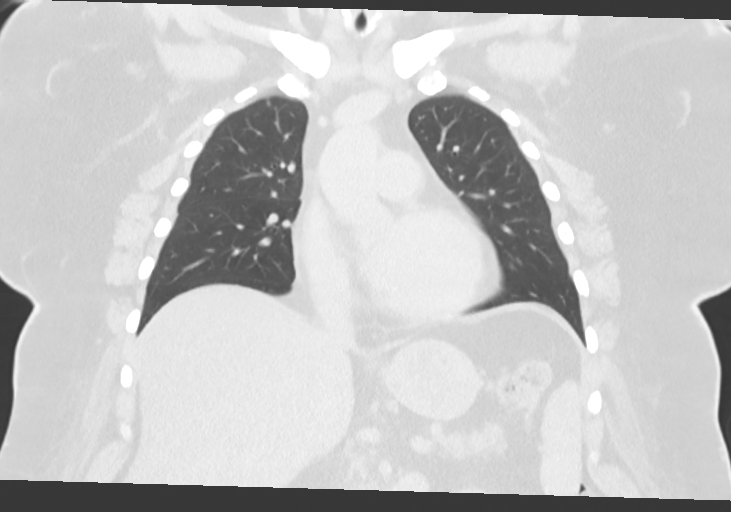

[15 of 36 positions shown; findings below may reference images not displayed]

FINDINGS: Lungs: The lungs are clear. No infiltrates are seen.

Mediastinum: No adenopathy is seen. There is mild calcification in the thoracic aorta. Trachea has a normal caliber and appears well-maintained.

Hila: Unremarkable.

Pleura: Normal.

Other: There is decreased density to the liver indicating hepatic steatosis. The liver is likely enlarged. No adrenal lesions are seen. No osseous lesions are seen. Patient does have an obese body habitus.
IMPRESSION: 1.
No acute findings.

2.
Hepatic steatosis and probable hepatomegaly.

3.
Additional comments and findings as above.

Stat fax

Total radiation dose to patient is CTDIvol 11.97 mGy and DLP 430.04 mGy-cm.

## 2019-02-04 IMAGING — CR CHEST 2 VWS PA LAT
1 series · 2 of 2 positions shown · non-contrast
Comparison: 10/05/2010

HISTORY: Shortness of breath
TECHNIQUE: PA and lateral chest radiographs

[Series 1: lat · 0.17mm/px · 2 of 2 slices shown]
[im 1/2]
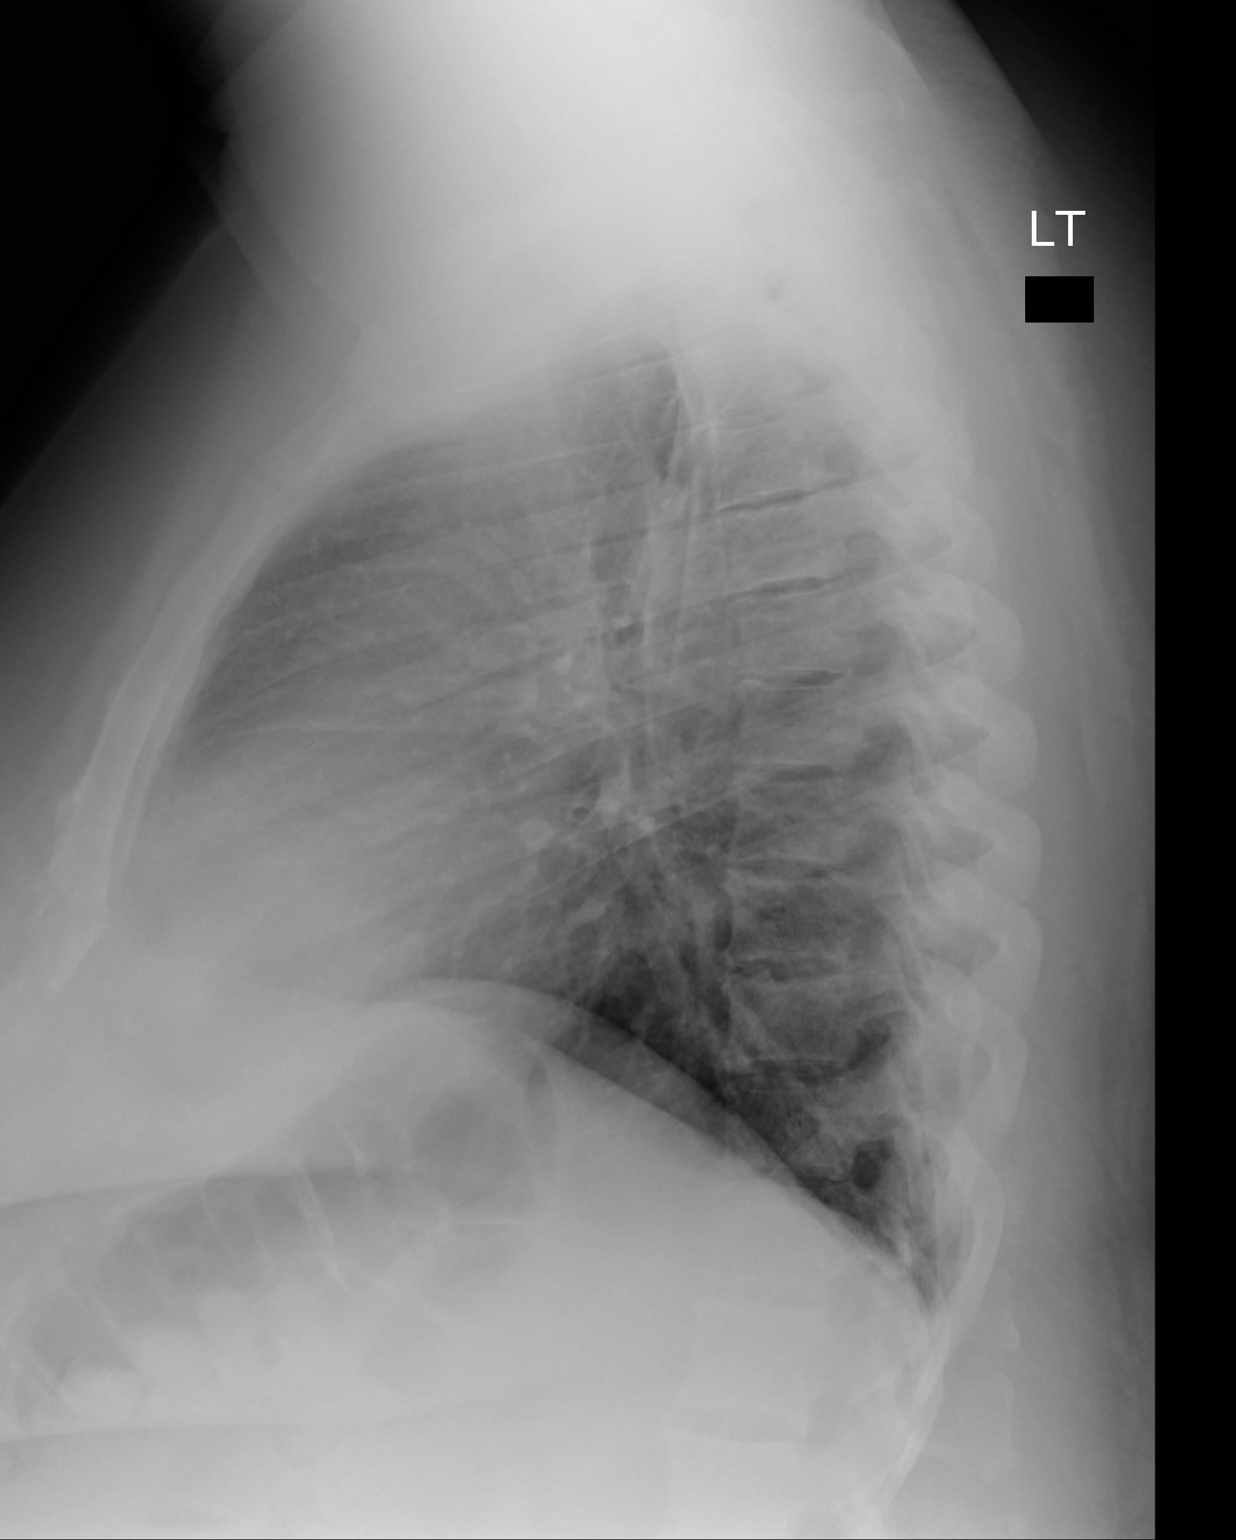
[im 2/2]
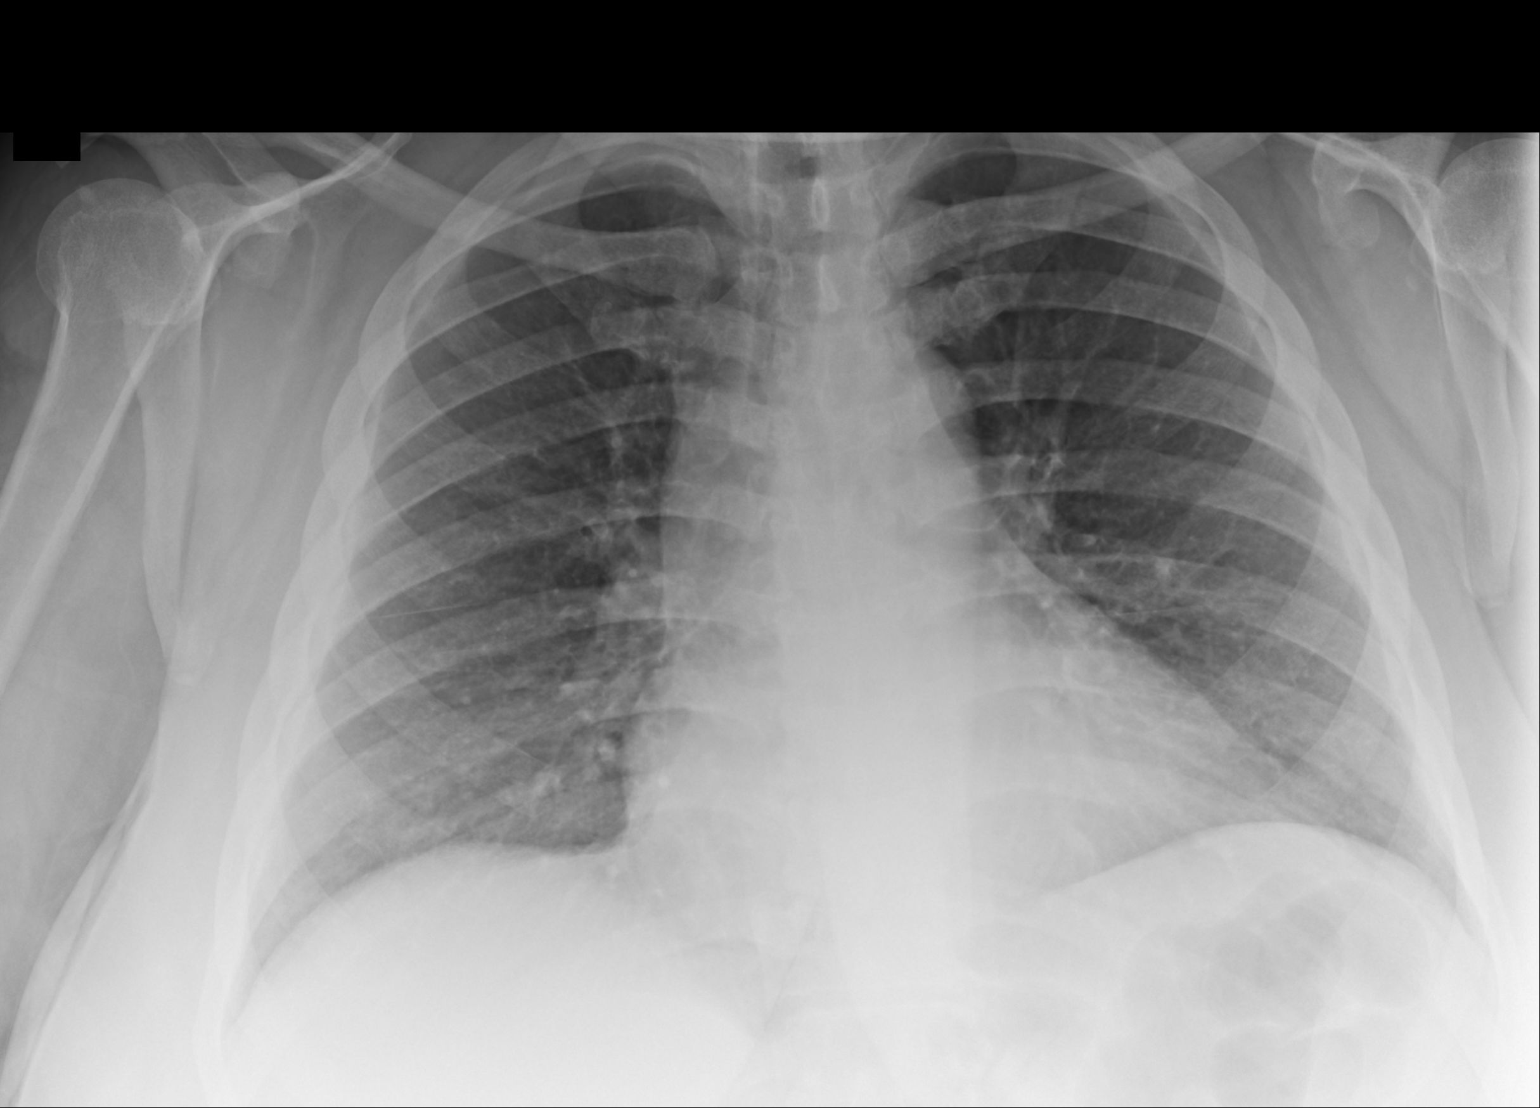

[2 of 2 positions shown; findings below may reference images not displayed]

FINDINGS: Heart size is normal. No pleural effusion or pneumothorax. No consolidation.
IMPRESSION: No acute chest findings.

## 2019-02-06 IMAGING — CT CTA CHEST WO-W CONTRAST
2 of 6 series · 11 of 46 positions shown, 17 images · IV contrast (APPLIED)
Comparison: CT chest study 02/04/2019.

HISTORY: Chest pain for 4 months.
TECHNIQUE: 5 mm axial images of CT chest study performed without contrast. After administration of 100 mL Msovue-1KB IV contrast, 2 mm axial images of CT angiogram chest study obtained. Coronal, sagittal, thick MIP rotational, as well as 3-D CT angiogram chest reformation images are obtained. Patient was premedicated for iodine allergy.

[Series 2: pre contrast · axial · non-contrast · 0.78mm/px · z∈[-136,+79]mm · 8 of 57 slices shown, 13 images]
[im 7/57  soft-tissue]
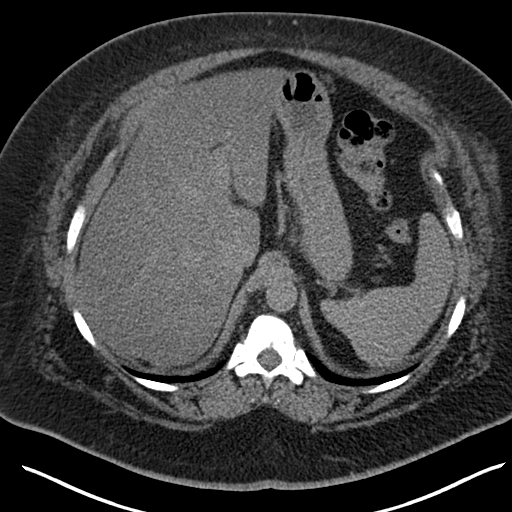
[im 7/57  bone]
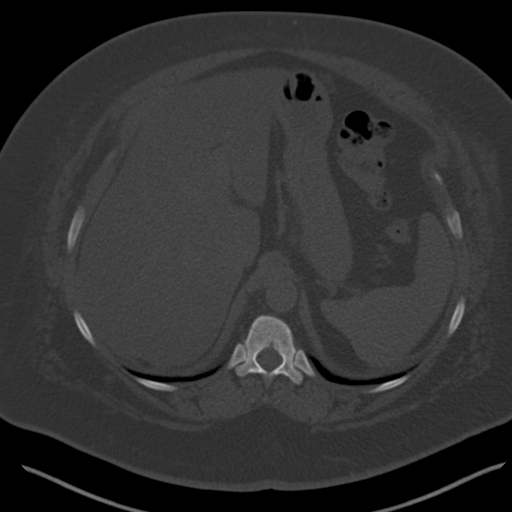
[im 13/57  soft-tissue]
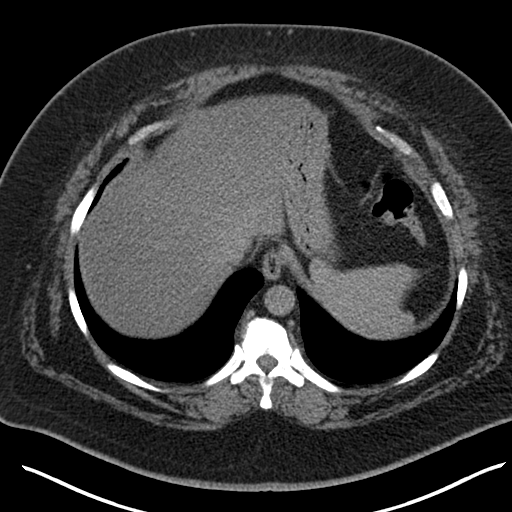
[im 19/57  soft-tissue]
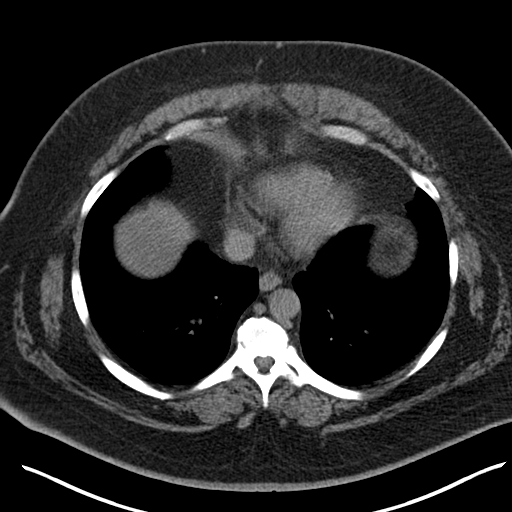
[im 25/57  soft-tissue]
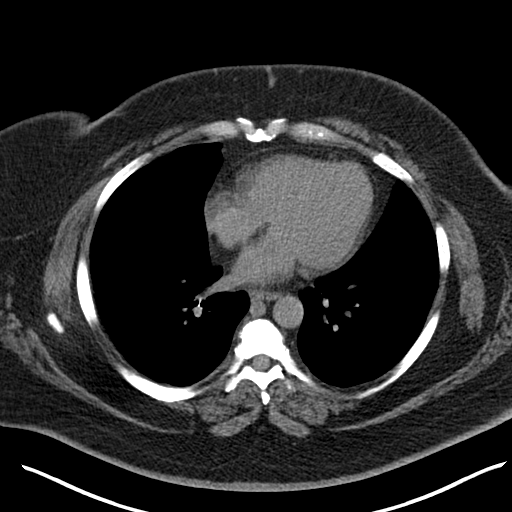
[im 32/57  soft-tissue]
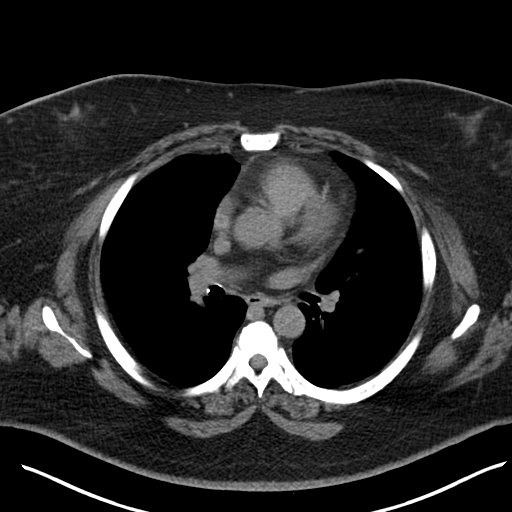
[im 32/57  lung]
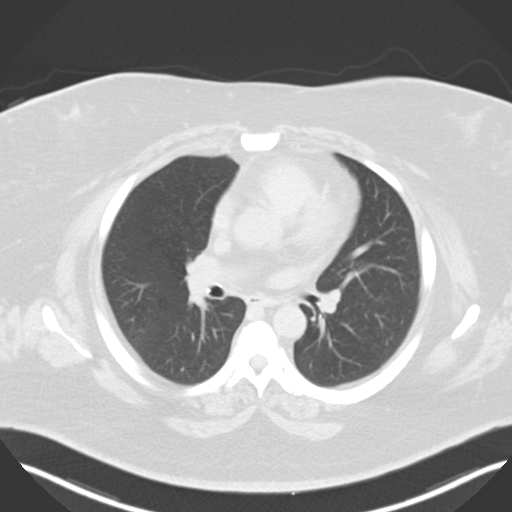
[im 38/57  soft-tissue]
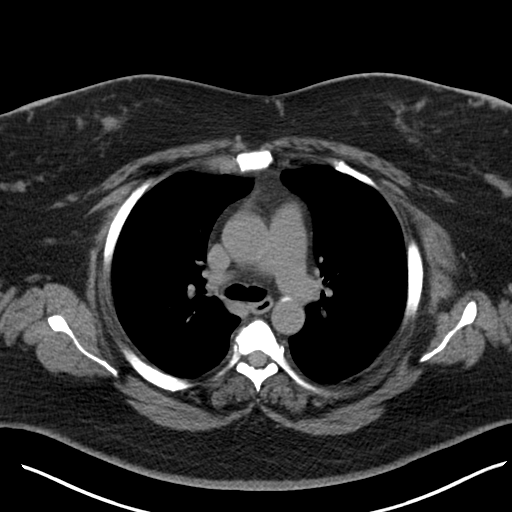
[im 38/57  lung]
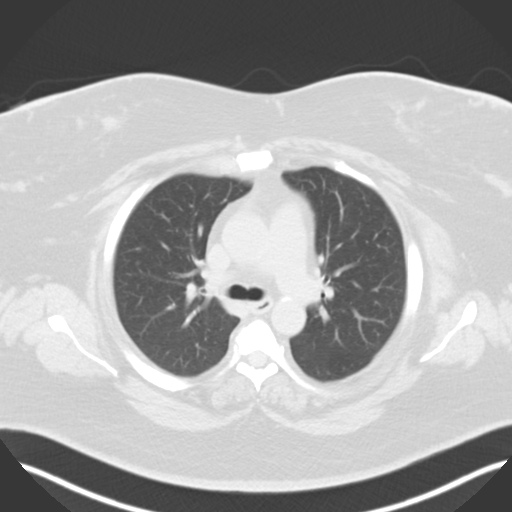
[im 44/57  soft-tissue]
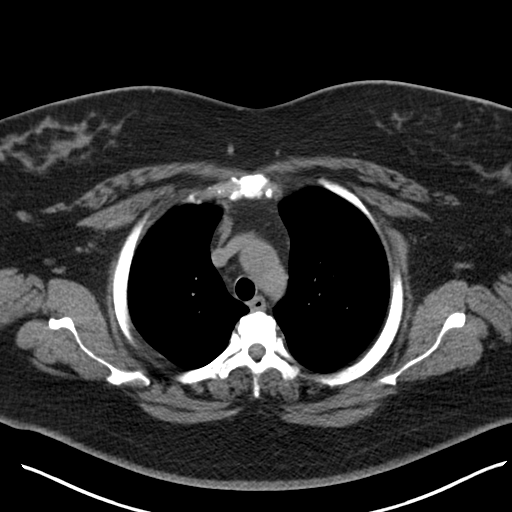
[im 44/57  lung]
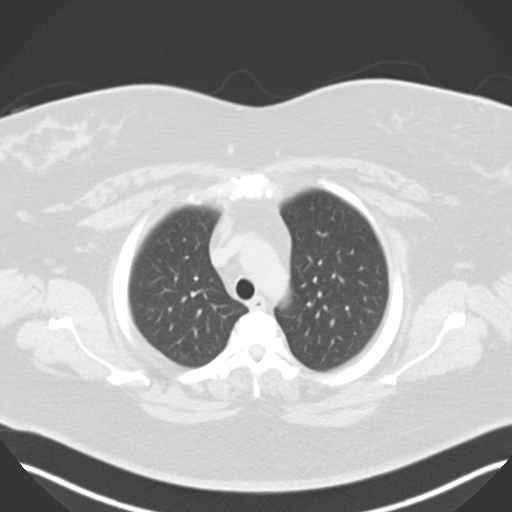
[im 50/57  soft-tissue]
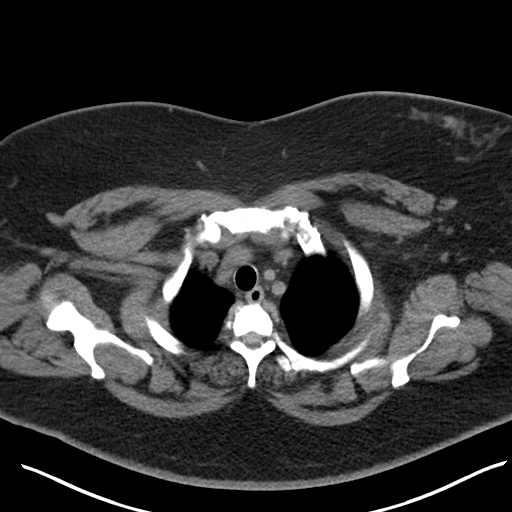
[im 50/57  lung]
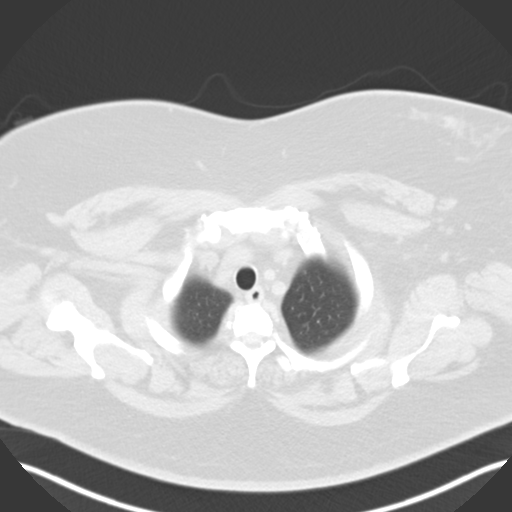

[Series 8: coronal · coronal · 0.51mm/px · 3 of 153 slices shown, 4 images]
[im 51/153  soft-tissue]
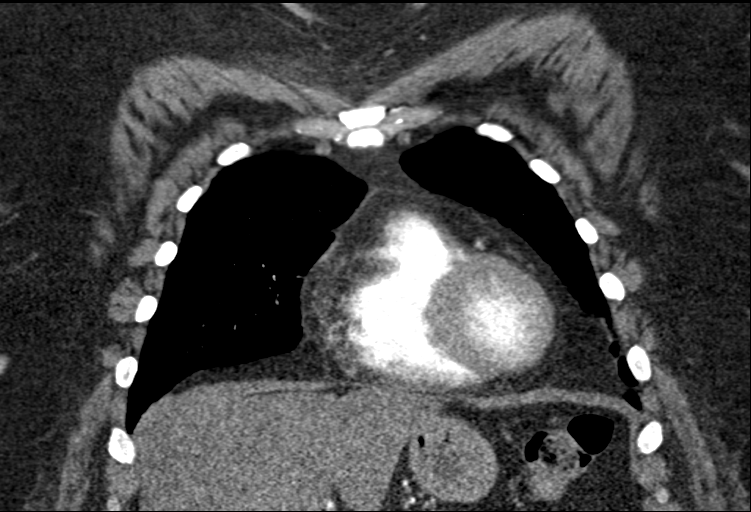
[im 68/153  soft-tissue]
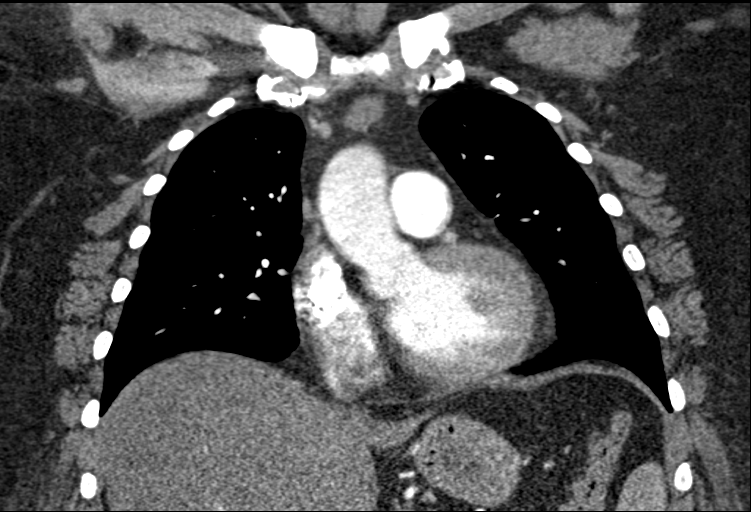
[im 68/153  bone]
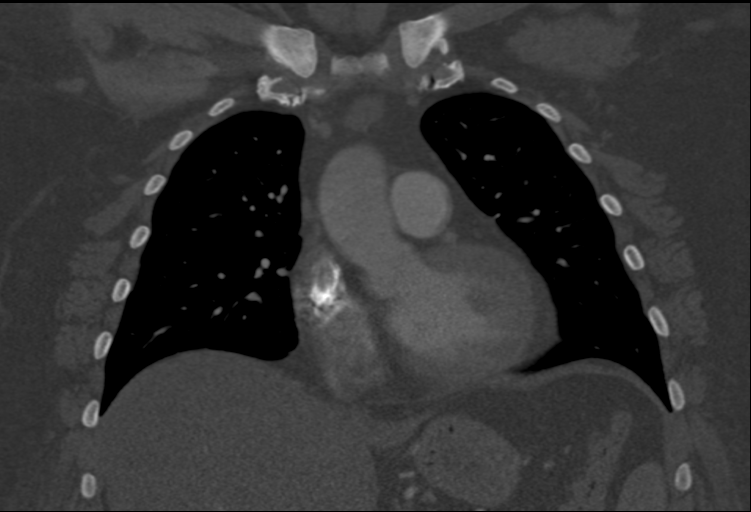
[im 85/153  soft-tissue]
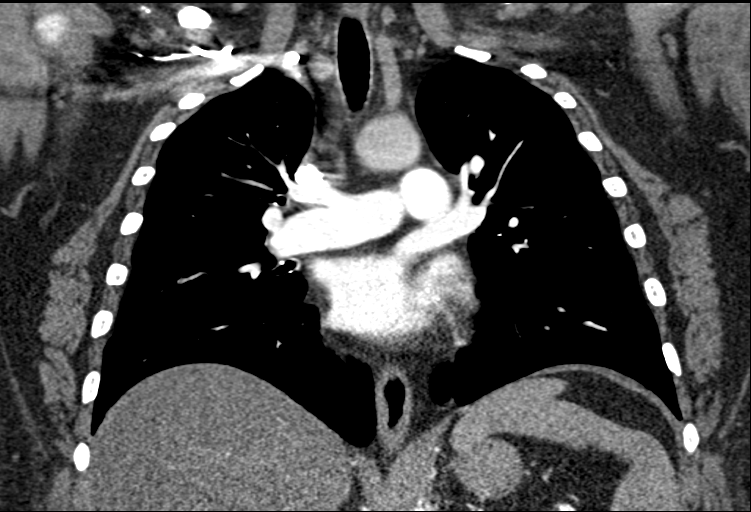

[11 of 46 positions shown; findings below may reference images not displayed]

FINDINGS: Pulmonary vasculature is within normal limits without pulmonary embolus. Heart size is normal. There is no pericardial effusion. Calcified plaques of aorta are seen without aneurysm or dissection.

Lungs are clear. Trachea and bronchi are normal.

Thyroid gland is not visualized. Esophagus is normal. No enlarged mediastinal or axillary lymph nodes seen. Fatty infiltration of liver is seen. Upper abdominal visceral organs are otherwise normal. Endplate spurs of spine are seen. Visualized breast tissues are normal. Increased subcutaneous adipose tissue is seen.

Coronal, sagittal, thick MIP rotational and 3-D CT angiogram chest reformation images confirm above findings.
IMPRESSION: There is no pulmonary embolus.

Additional chronic findings of chest and upper abdomen as noted.

Total radiation dose to patient is CTDIvol 69.31 mGy and DLP 1640.88 mGy-cm.

STAT Fax

## 2020-03-31 IMAGING — MG MAMMO SCRN BIL W/CAD TOMO
8 of 13 series · 8 of 29 positions shown · non-contrast
Comparison: The present examination has been compared to prior imaging studies.

Images Obtained from [facility]
INDICATION: Screening.
TECHNIQUE: Bilateral 2-D digital screening mammogram was performed followed by 3-D tomosynthesis.  Current study was also evaluated with a computer aided detection (CAD) system.
MAMMOGRAM FINDINGS:
The breasts are heterogeneously dense, which may obscure small masses.
There are calcifications with grouped distribution in the upper outer quadrant of both breasts.

[R MLO (1 of 3)]
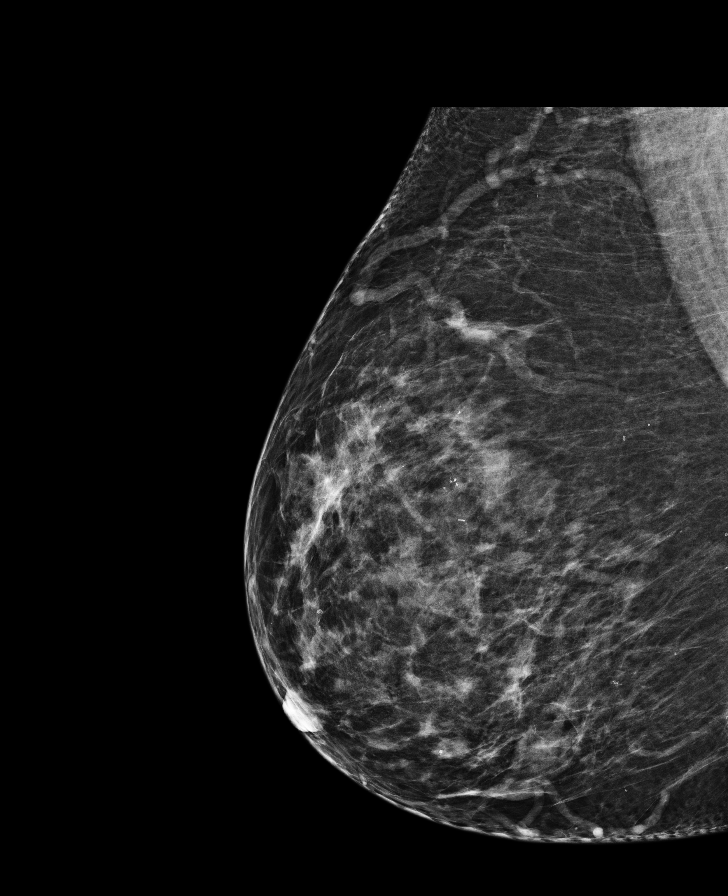

[R CC (1 of 2)]
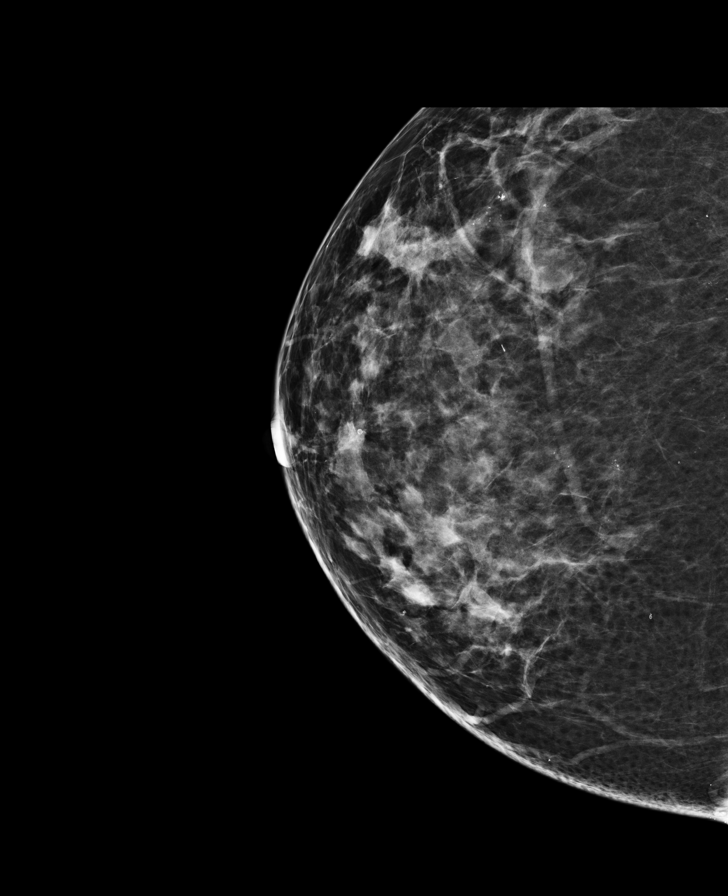

[L CC (1 of 2)]
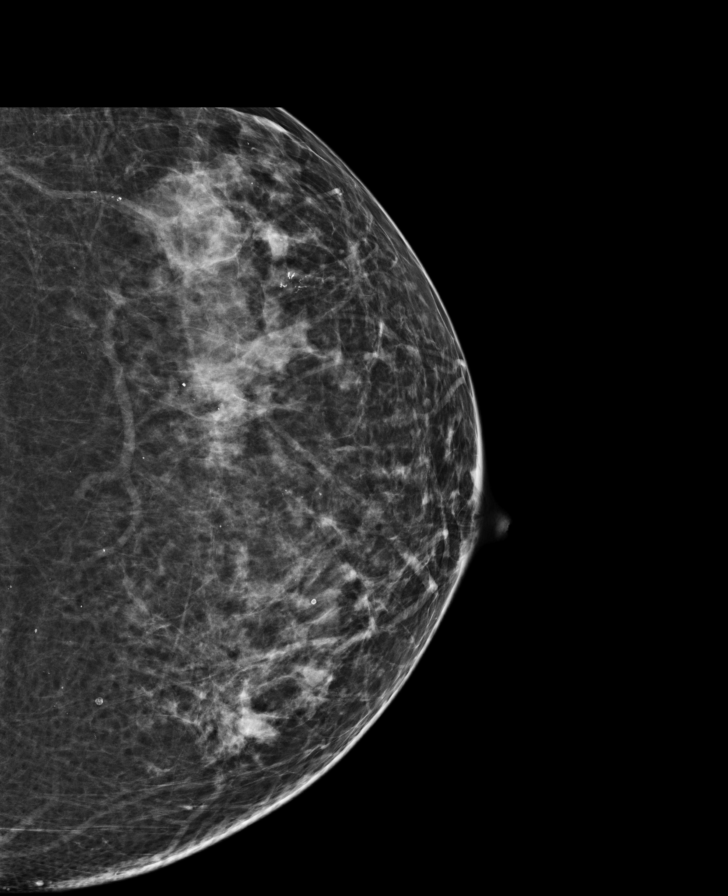

[L MLO]
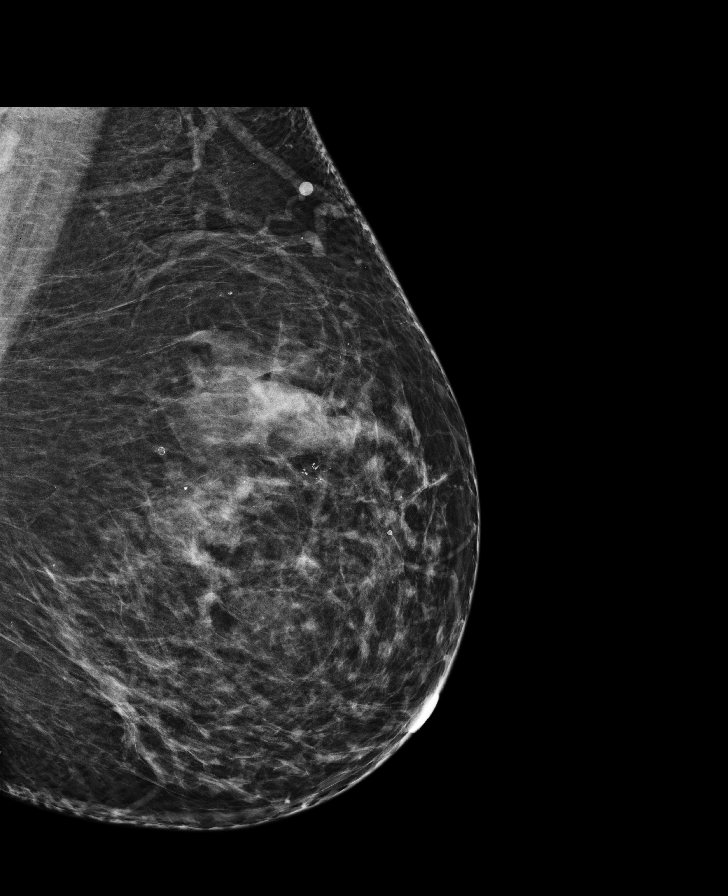

[R MLO (2 of 3)]
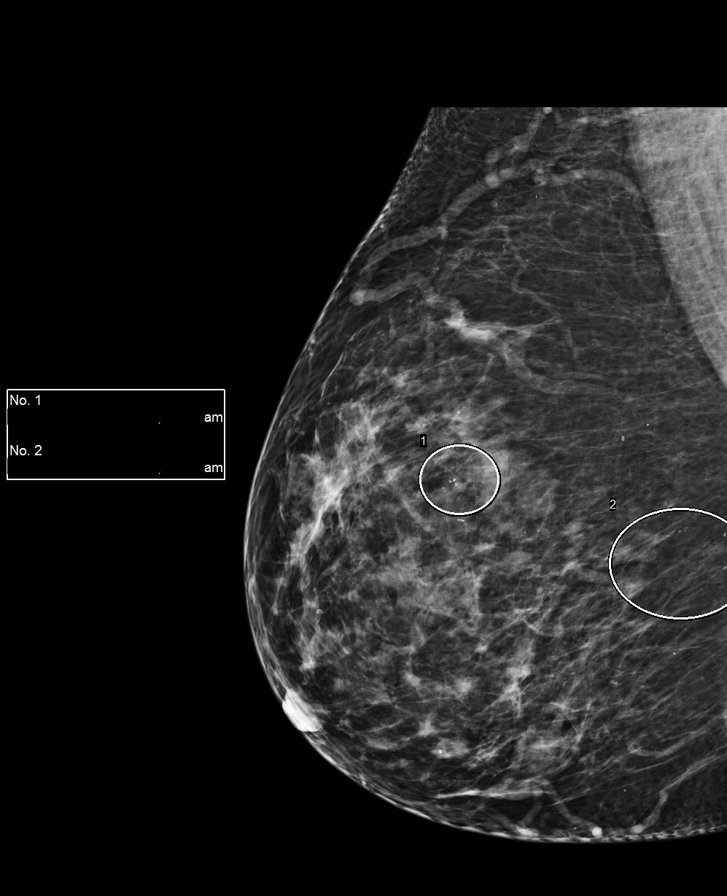

[R CC (2 of 2)]
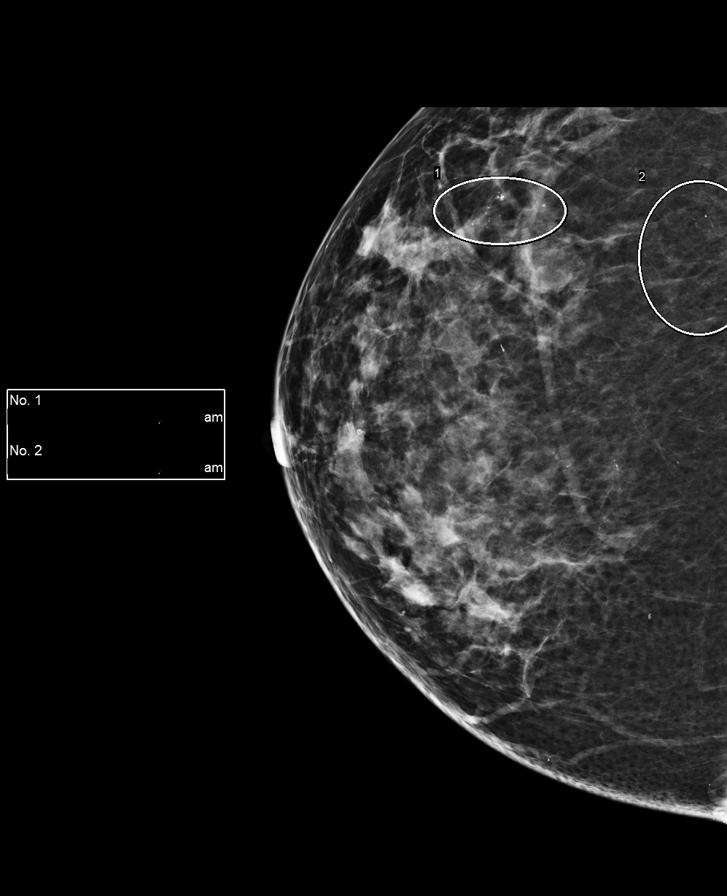

[R MLO (3 of 3)]
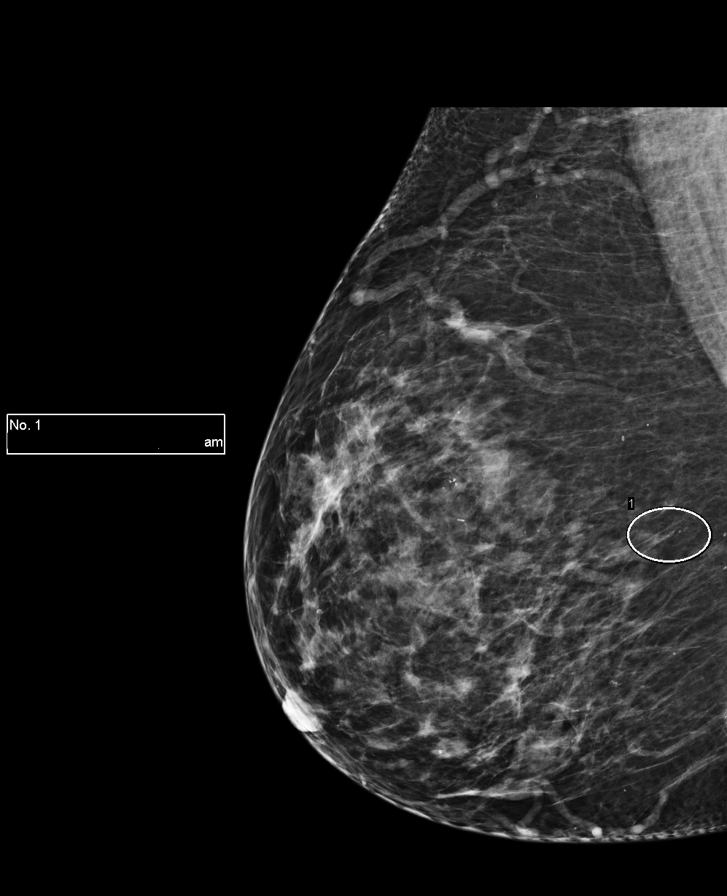

[L CC (2 of 2)]
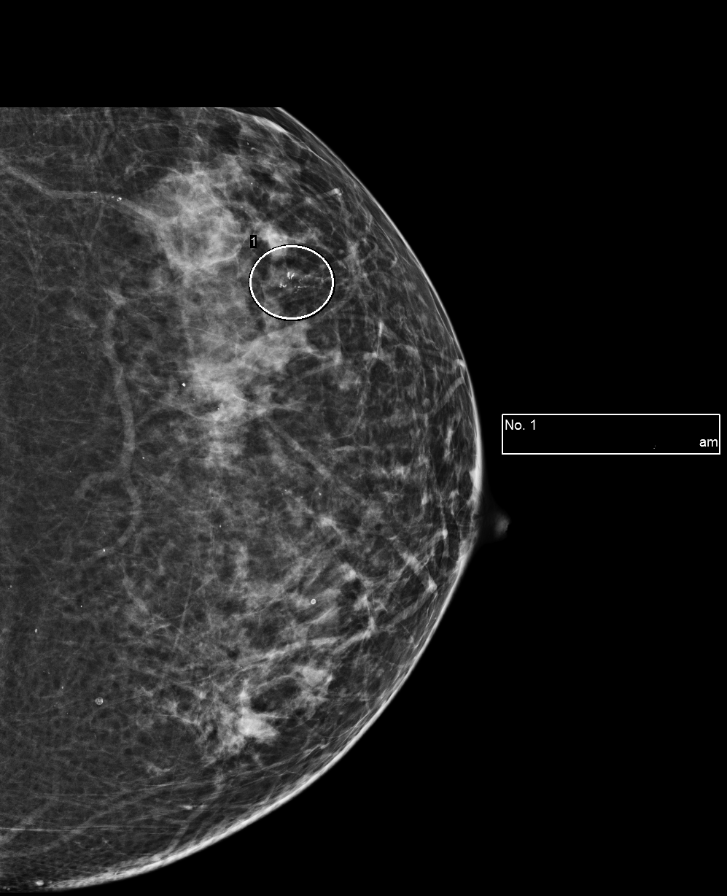

[8 of 29 positions shown; findings below may reference images not displayed]

IMPRESSION: Calcifications in both breasts require additional evaluation. Diagnostic mammogram with possible ultrasound recommended.
BI-RADS Category 0: Incomplete: Needs Additional Imaging Evaluation

## 2020-05-23 IMAGING — MG MAMMO DIAG BIL
8 series · 8 of 8 positions shown · non-contrast
Comparison: The present examination has been compared to prior imaging studies.

HISTORY: Patient is 49 years old and is seen for diagnostic evaluation of calcifications in the upper outer quadrant of both breasts. The patient has no personal history of cancer. The patient has the following family history of breast cancer:  mother, at age 61, breast cancer.
TECHNIQUE: Bilateral 2-D digital diagnostic mammogram was performed.

[R LM (1 of 3)]
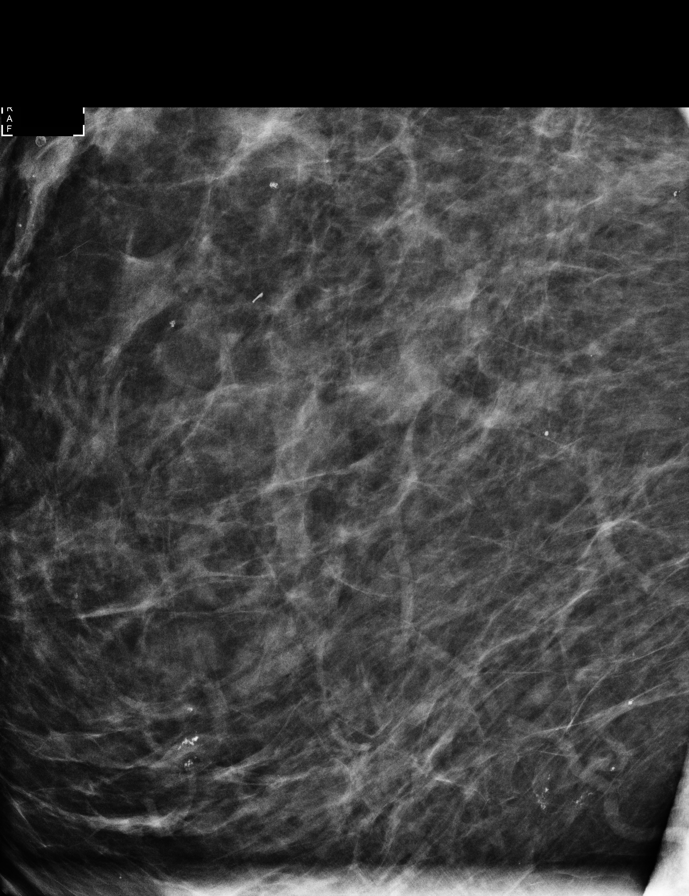

[R LM (2 of 3)]
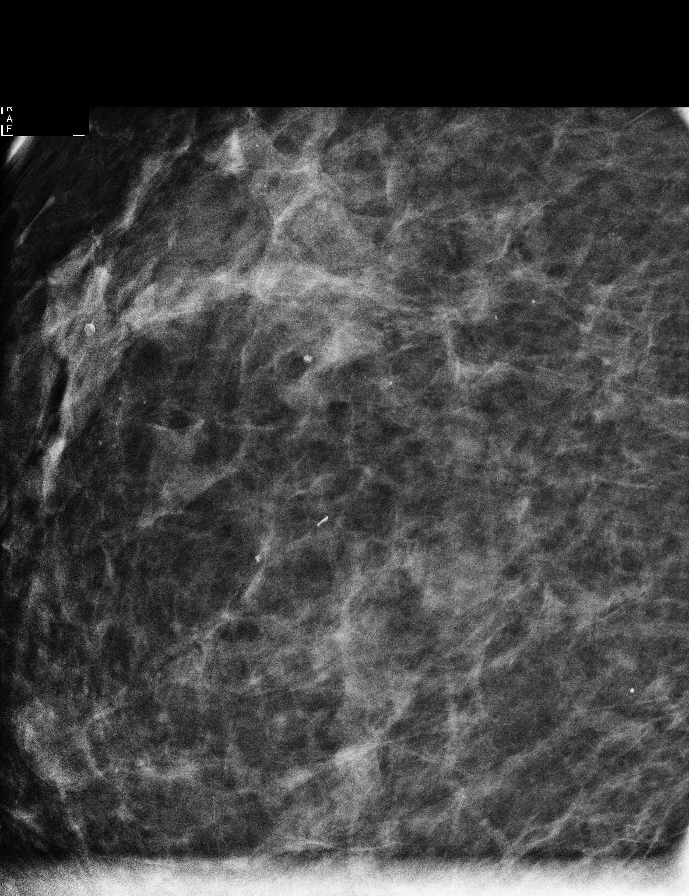

[L CC]
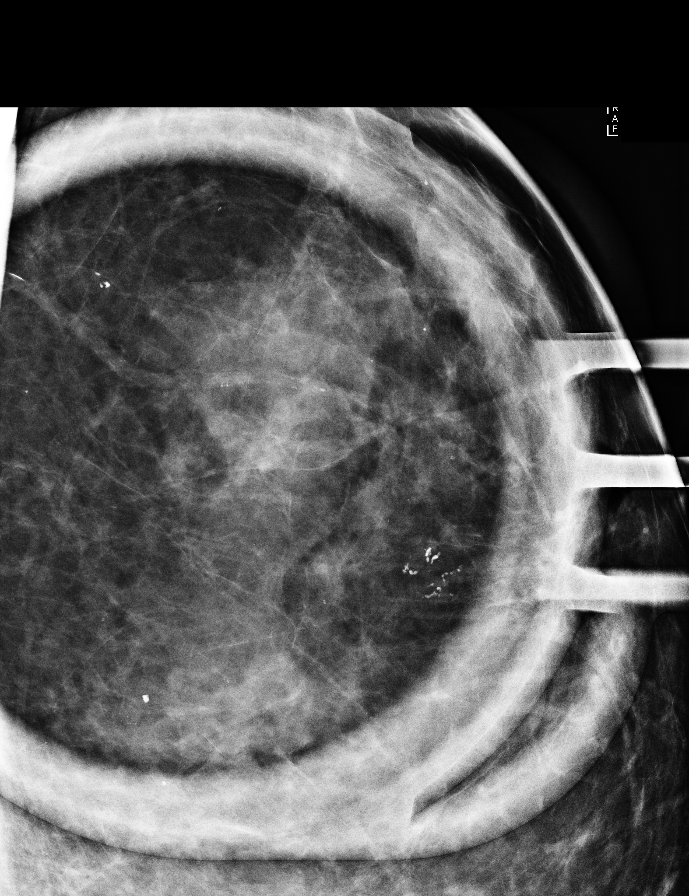

[L LM (1 of 2)]
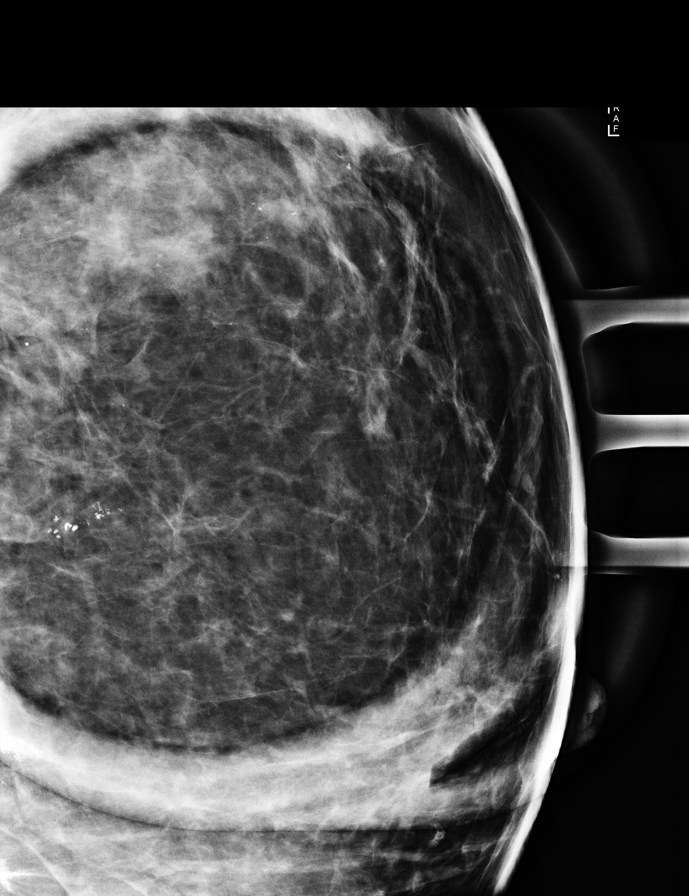

[R CC (1 of 2)]
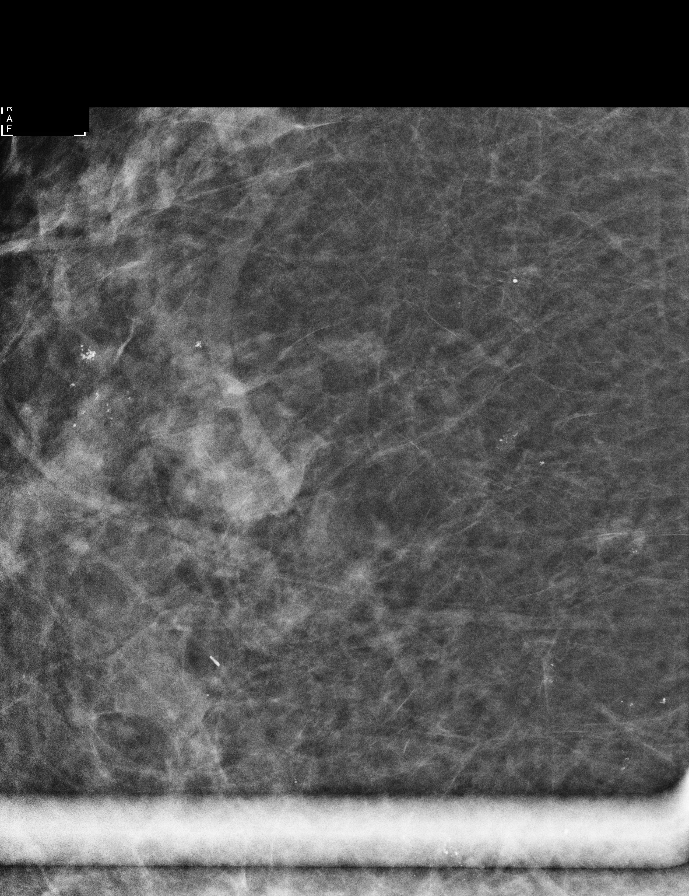

[L LM (2 of 2)]
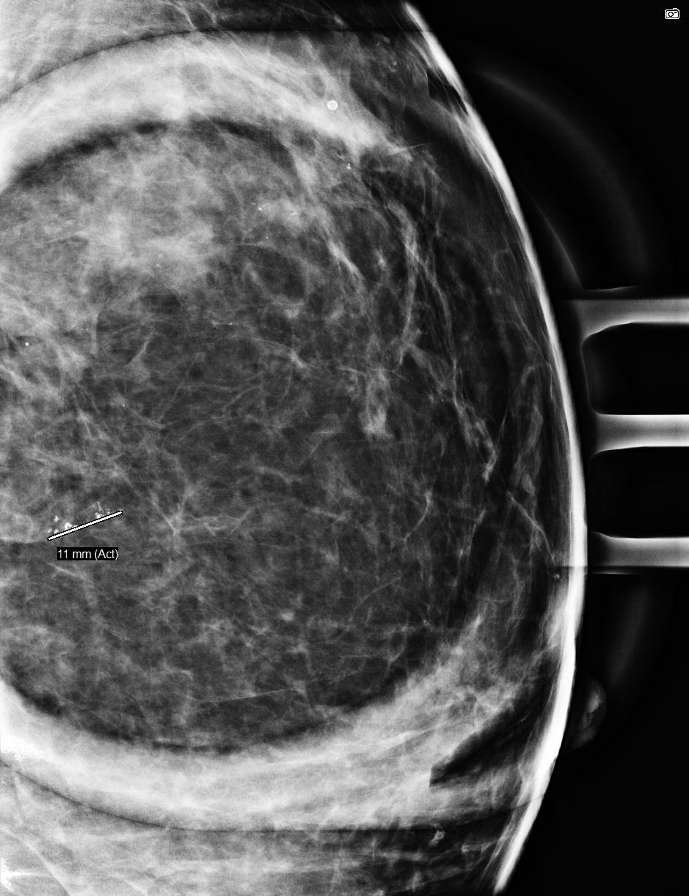

[R LM (3 of 3)]
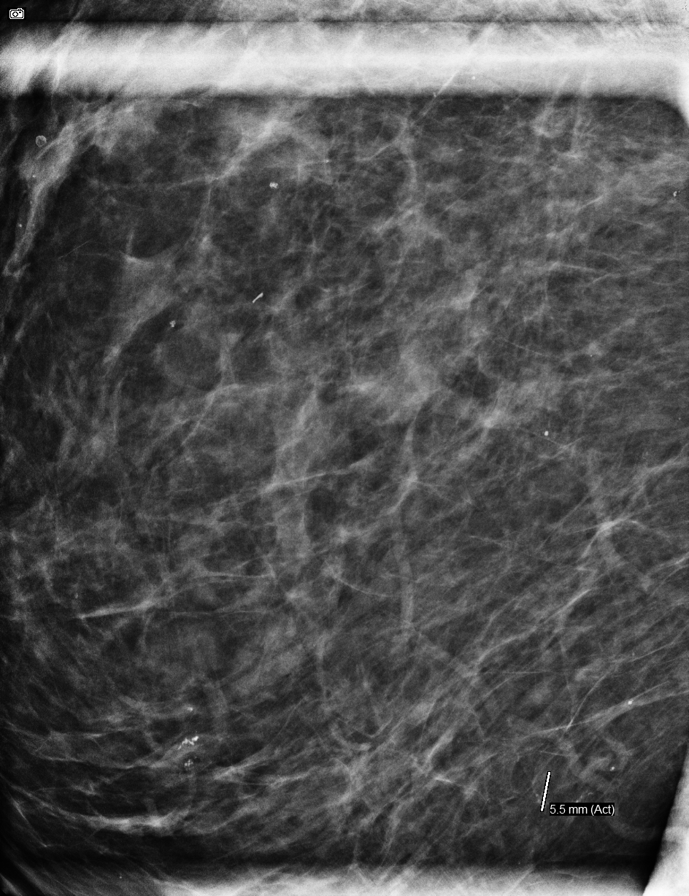

[R CC (2 of 2)]
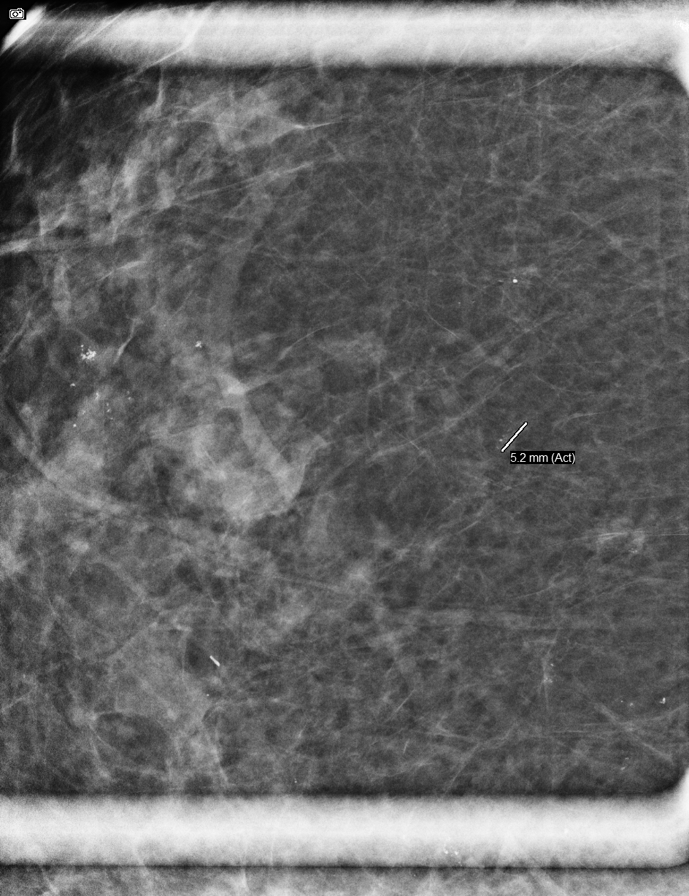

[8 of 8 positions shown; findings below may reference images not displayed]

MAMMOGRAM FINDINGS:

The breasts are heterogeneously dense, which may obscure small masses.

Additional evaluation was performed for the calcifications in both breasts, upper outer quadrant seen on 03/31/2020. On the present examination, there are multiple groups of coarse heterogeneous calcifications throughout the right breast. The most suspicious cluster is located at 6 o'clock, posterior depth, and measures 5.5 mm. Also seen are multiple groups of coarse heterogeneous calcifications in the left breast, the largest measuring 11 mm located in the 2 o'clock, anterior depth.
IMPRESSION: Calcifications in both breasts are suspicious. A stereotactically guided biopsy is recommended of the most suspicious cluster on each side.

Findings and recommendations discussed with the patient. She plans to schedule the procedure. The patient received a copy of the results at the end of the examination.

BI-RADS Category 4: Suspicious

## 2020-05-31 IMAGING — MG MAMMO DIAG BIL
7 of 10 series · 7 of 14 positions shown · non-contrast
Comparison: none

INDICATION: 49 years-old Female referred for stereotactic core needle biopsy of bilateral breast calcifications.

[R CC]
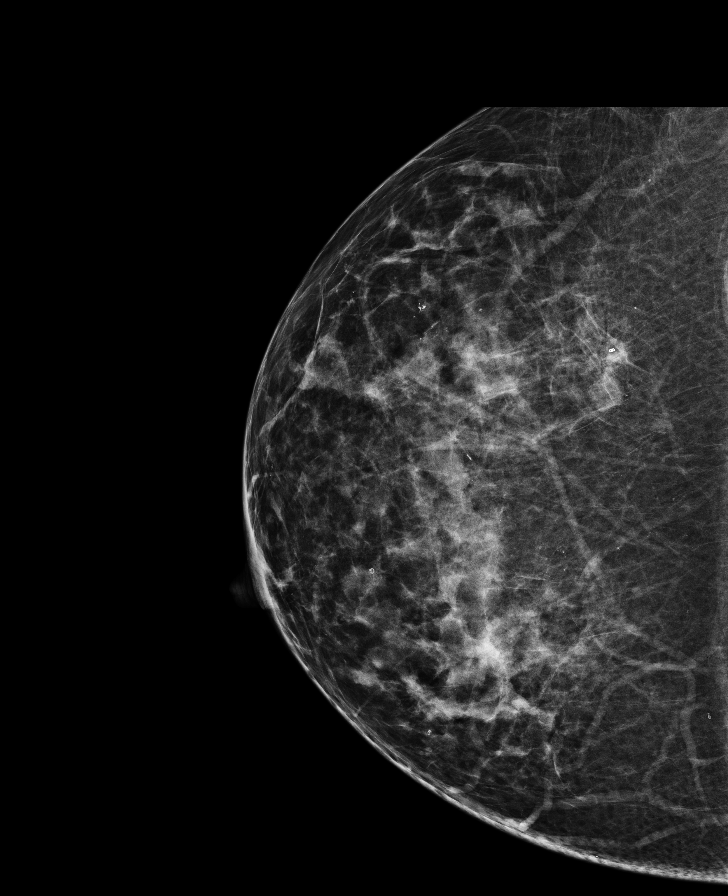

[R ML]
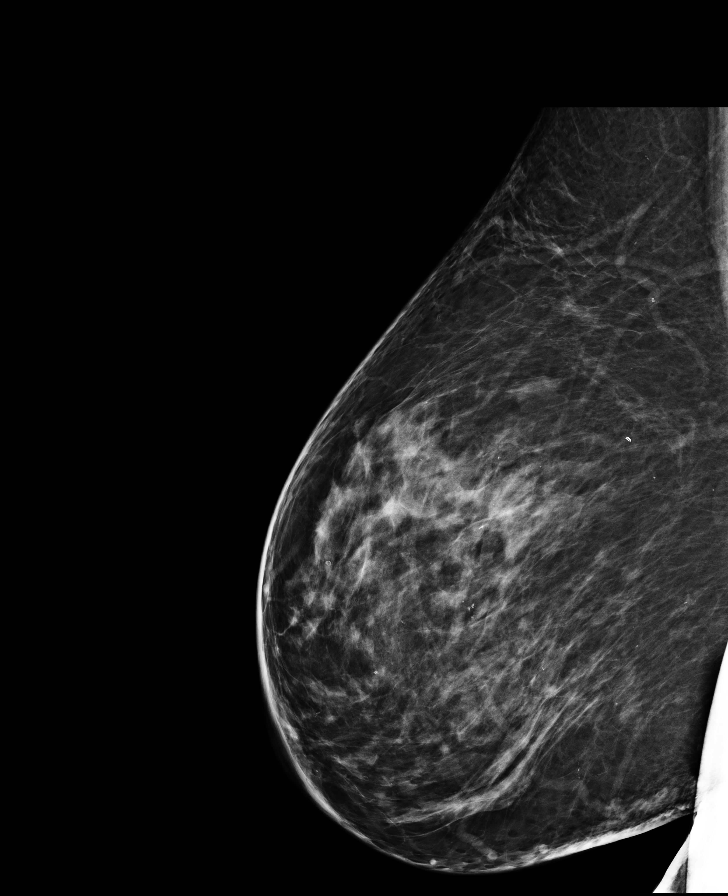

[L ML]
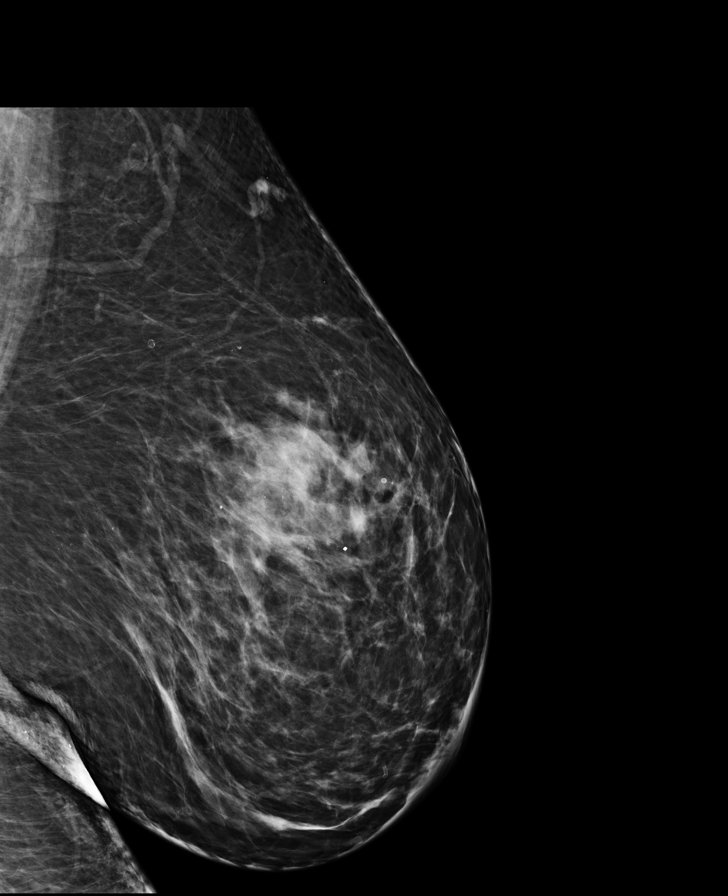

[L SPECIMEN]
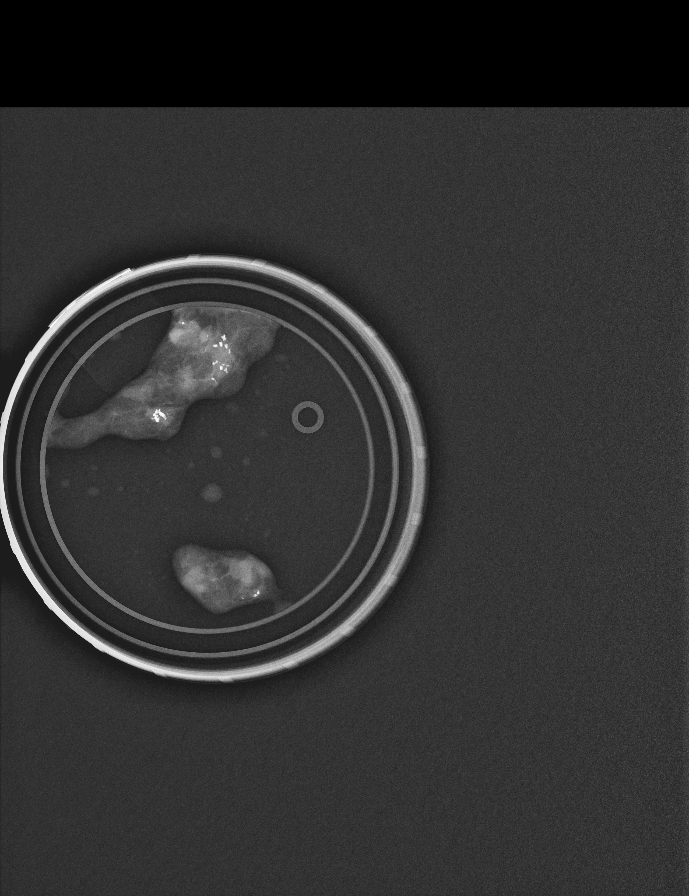

[R SPECIMEN]
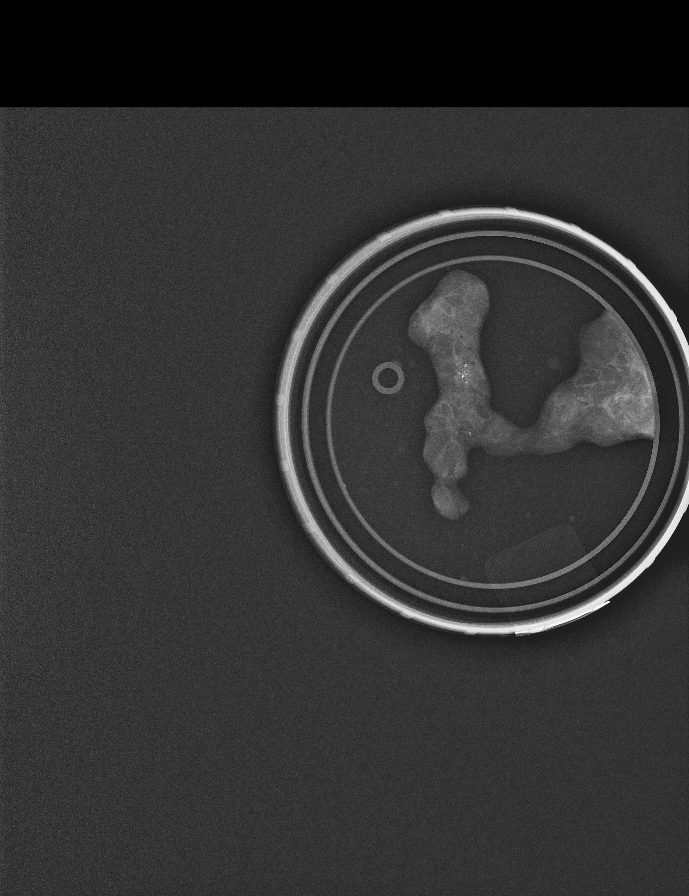

[L CC]
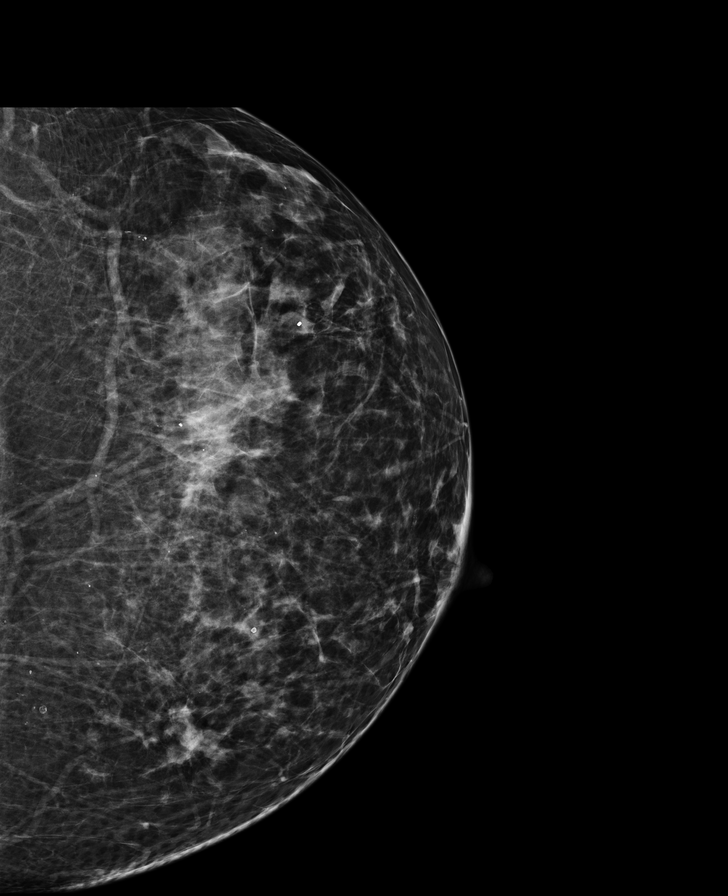

[R MLO]
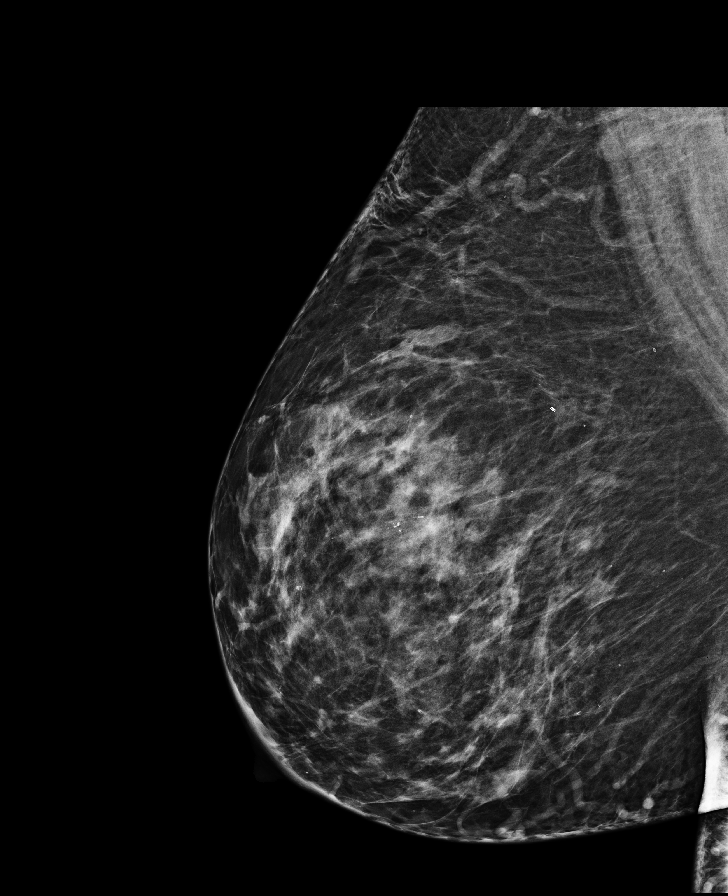

[7 of 14 positions shown; findings below may reference images not displayed]

PROCEDURE:

Patient consent: Prior to the procedure risks, complications, alternatives and a description of the procedure were discussed. All questions were answered.  Informed consent was obtained and signed.  

Procedure description:

RIGHT BREAST:

A stereotactic/tomosynthesis guided biopsy was performed for the 6 mm clustered calcifications in the right breast at 9 o'clock.  This was previously described in the prior mammogram report as located at 6 o'clock, however the location on today's imaging is confirmed at 9 o'clock. The skin was prepped in the usual manner. The local area was anesthetized with 1% lidocaine.  A skin nick was made in the breast.  The abnormality was approached superiorly using an upright digital mammography unit.  A 9-gauge Suros-Eviva vacuum-assisted biopsy needle was placed adjacent to the abnormality under computer guidance and confirmatory stereotactic mammography images were obtained to document needle placement.  Once the needle was documented to be in the correct location, multiple specimens were obtained. Specimen radiograph confirms calcifications within the specimen.  A clip was inserted into the biopsy cavity. Hemostasis was achieved with manual compression. Sterile skin strips were applied to the access site.

LEFT BREAST:

A stereotactic/tomosynthesis guided biopsy was performed for the 11 mm cluster of coarse heterogeneous calcifications in the left breast at 2 o'clock. The skin was prepped in the usual manner. The local area was anesthetized with 1% lidocaine.  A skin nick was made in the breast.  The abnormality was approached superiorly using an upright digital mammography unit.  A 9-gauge Suros-Eviva vacuum-assisted biopsy needle was placed adjacent to the abnormality under computer guidance and confirmatory stereotactic mammography images were obtained to document needle placement.  Once the needle was documented to be in the correct location, multiple specimens were obtained. Specimen radiograph confirms calcifications within the specimen.  A clip was inserted into the biopsy cavity. Hemostasis was achieved with manual compression. Sterile skin strips were applied to the access site.

Post procedure digital mammographic imaging demonstrates the left breast biopsy marker located approximately 2 cm superior from the biopsy cavity. The left breast biopsy clip is at the appropriate biopsy location. The specimens were sent to the laboratory for pathological analysis. The patient was given appropriate post care instructions and discharged in good condition. A female technologist was present throughout the procedure.
IMPRESSION: 1. Right breast: Stereotactic guided biopsy of the 6 mm cluster of calcifications in the right breast at 9 o'clock was successful with no apparent post procedure complications. The pathology requisition labels the calcifications at 6 o'clock, however post biopsy mammogram confirms that the calcifications are in fact located at 9 o'clock. Pathology indicates benign fibroadenoma with complications. Pathology results are concordant with imaging findings.  Recommend follow-up mammogram in 6 months to assess stability of multiple additional clusters of calcifications.

2. Left breast: Stereotactic guided biopsy of the 11 mm cluster of calcifications in the left breast at 3 o'clock was successful with no apparent post procedure complications.  Pathology indicates benign fibroadenoma with calcifications. Pathology results are concordant with imaging findings.  Recommend follow-up mammogram in 6 months to confirm stability.

The biopsy results and follow-up recommendations were discussed with the patient.

## 2020-05-31 IMAGING — MG STEREO BIOPSY LT
4 series · 4 of 20 positions shown · non-contrast
Comparison: none

INDICATION: 49 years-old Female referred for stereotactic core needle biopsy of bilateral breast calcifications.

[L CC tomo (1 of 4) · tomo slice 33/65.0]
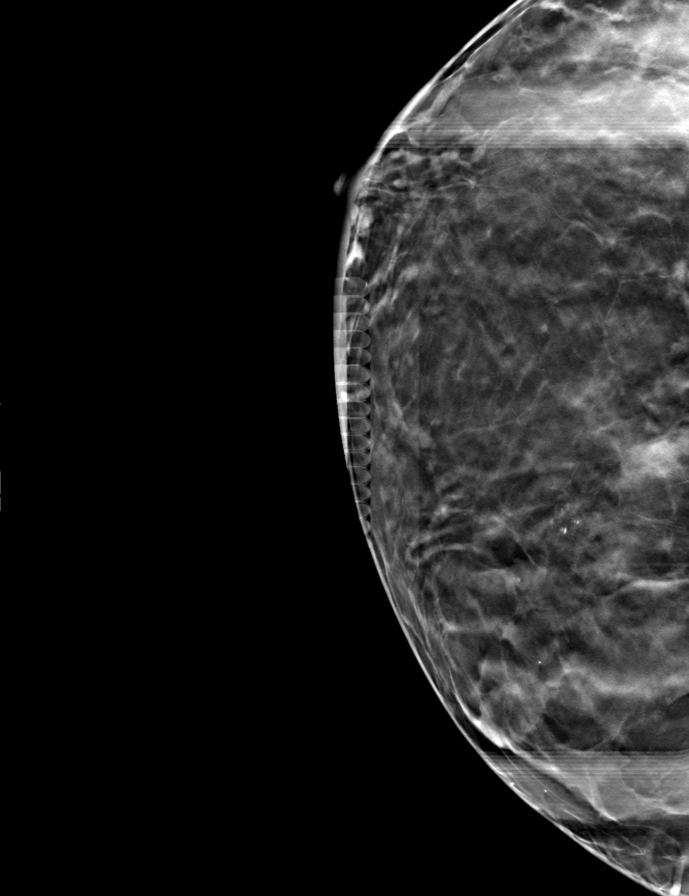

[L CC tomo (2 of 4) · tomo slice 33/65.0]
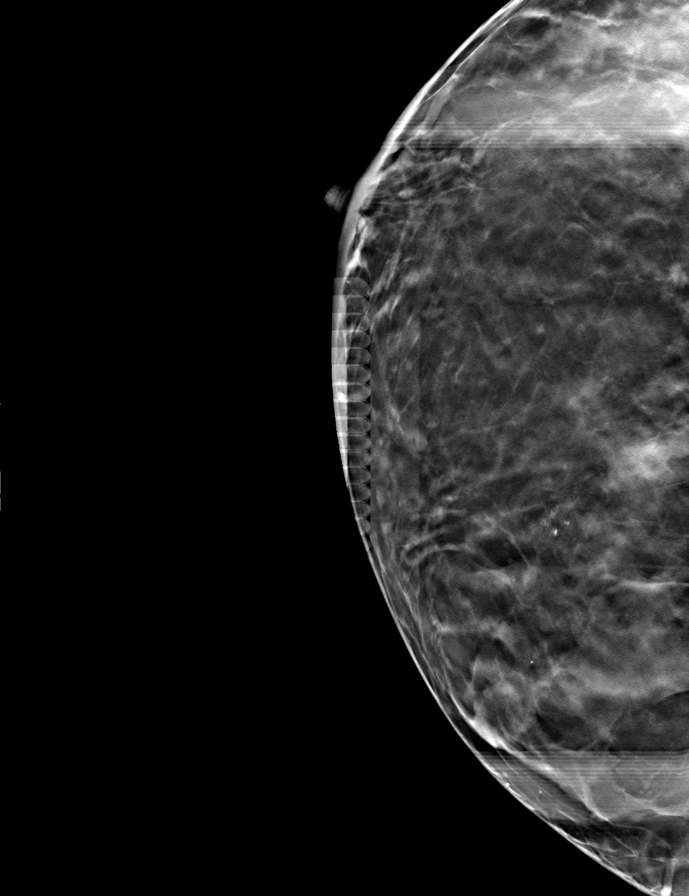

[L CC tomo (3 of 4) · tomo slice 33/65.0]
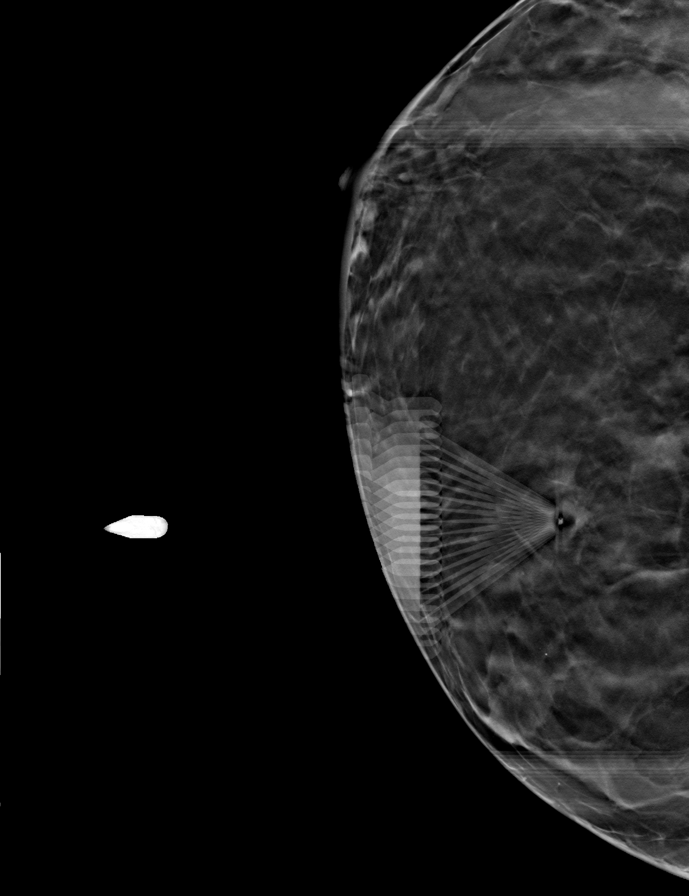

[L CC tomo (4 of 4) · tomo slice 33/64.0]
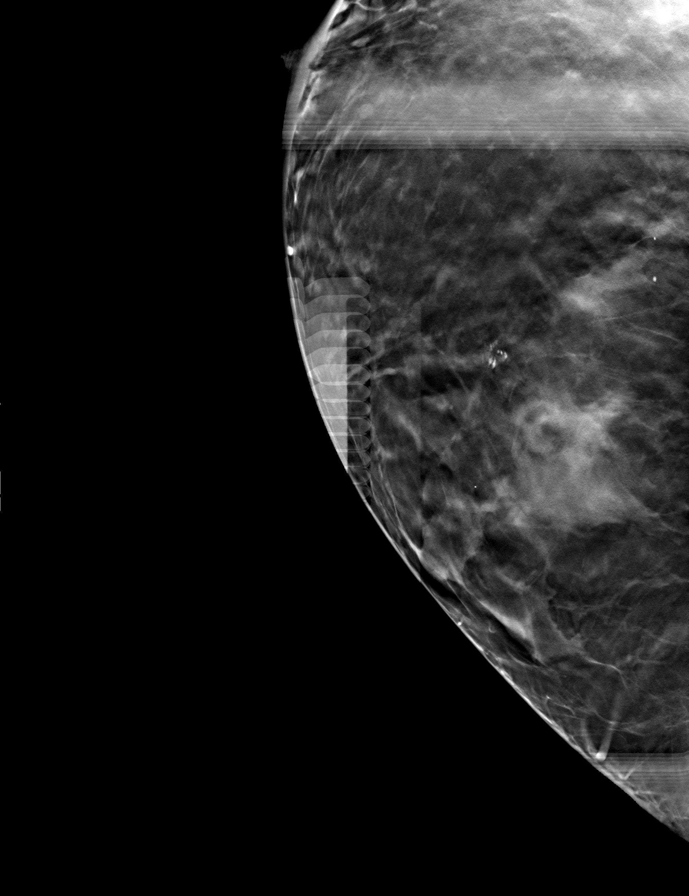

[4 of 20 positions shown; findings below may reference images not displayed]

PROCEDURE:

Patient consent: Prior to the procedure risks, complications, alternatives and a description of the procedure were discussed. All questions were answered.  Informed consent was obtained and signed.  

Procedure description:

RIGHT BREAST:

A stereotactic/tomosynthesis guided biopsy was performed for the 6 mm clustered calcifications in the right breast at 9 o'clock.  This was previously described in the prior mammogram report as located at 6 o'clock, however the location on today's imaging is confirmed at 9 o'clock. The skin was prepped in the usual manner. The local area was anesthetized with 1% lidocaine.  A skin nick was made in the breast.  The abnormality was approached superiorly using an upright digital mammography unit.  A 9-gauge Suros-Eviva vacuum-assisted biopsy needle was placed adjacent to the abnormality under computer guidance and confirmatory stereotactic mammography images were obtained to document needle placement.  Once the needle was documented to be in the correct location, multiple specimens were obtained. Specimen radiograph confirms calcifications within the specimen.  A clip was inserted into the biopsy cavity. Hemostasis was achieved with manual compression. Sterile skin strips were applied to the access site.

LEFT BREAST:

A stereotactic/tomosynthesis guided biopsy was performed for the 11 mm cluster of coarse heterogeneous calcifications in the left breast at 2 o'clock. The skin was prepped in the usual manner. The local area was anesthetized with 1% lidocaine.  A skin nick was made in the breast.  The abnormality was approached superiorly using an upright digital mammography unit.  A 9-gauge Suros-Eviva vacuum-assisted biopsy needle was placed adjacent to the abnormality under computer guidance and confirmatory stereotactic mammography images were obtained to document needle placement.  Once the needle was documented to be in the correct location, multiple specimens were obtained. Specimen radiograph confirms calcifications within the specimen.  A clip was inserted into the biopsy cavity. Hemostasis was achieved with manual compression. Sterile skin strips were applied to the access site.

Post procedure digital mammographic imaging demonstrates the left breast biopsy marker located approximately 2 cm superior from the biopsy cavity. The left breast biopsy clip is at the appropriate biopsy location. The specimens were sent to the laboratory for pathological analysis. The patient was given appropriate post care instructions and discharged in good condition. A female technologist was present throughout the procedure.
IMPRESSION: 1. Right breast: Stereotactic guided biopsy of the 6 mm cluster of calcifications in the right breast at 9 o'clock was successful with no apparent post procedure complications. The pathology requisition labels the calcifications at 6 o'clock, however post biopsy mammogram confirms that the calcifications are in fact located at 9 o'clock. Pathology indicates benign fibroadenoma with complications. Pathology results are concordant with imaging findings.  Recommend follow-up mammogram in 6 months to assess stability of multiple additional clusters of calcifications.

2. Left breast: Stereotactic guided biopsy of the 11 mm cluster of calcifications in the left breast at 3 o'clock was successful with no apparent post procedure complications.  Pathology indicates benign fibroadenoma with calcifications. Pathology results are concordant with imaging findings.  Recommend follow-up mammogram in 6 months to confirm stability.

The biopsy results and follow-up recommendations were discussed with the patient.

## 2020-05-31 IMAGING — MG STEREO BIOPSY RT
6 series · 6 of 18 positions shown · non-contrast
Comparison: none

INDICATION: 49 years-old Female referred for stereotactic core needle biopsy of bilateral breast calcifications.

[R CC (1 of 3)]
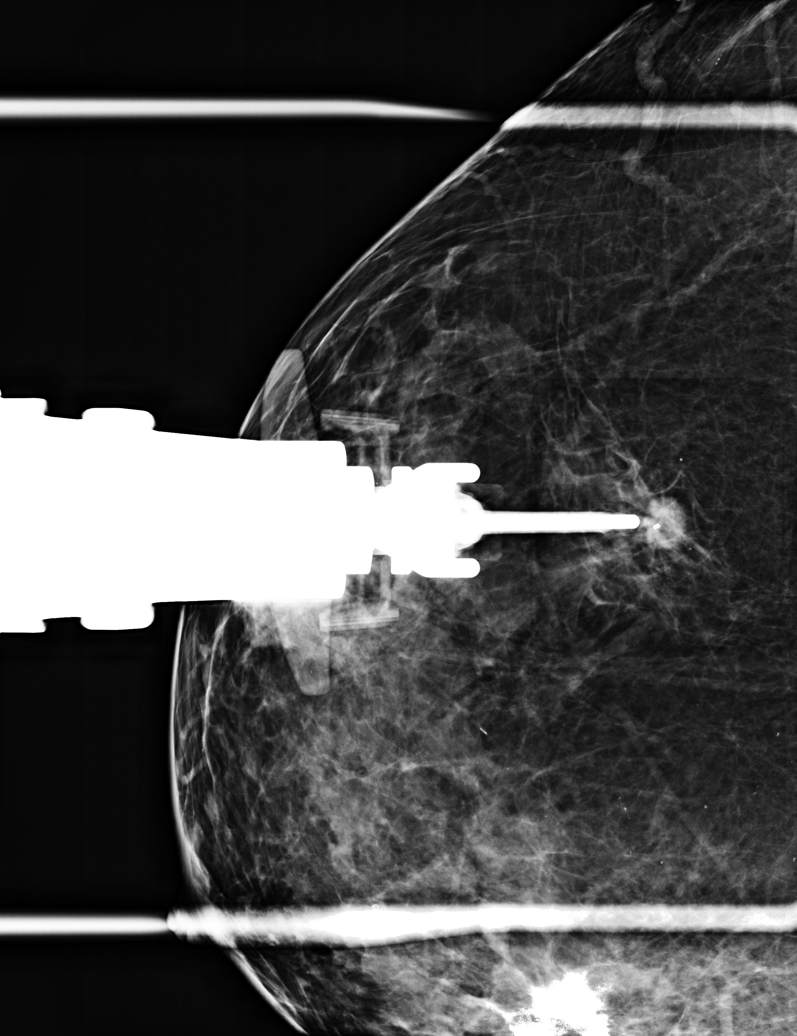

[R CC (2 of 3)]
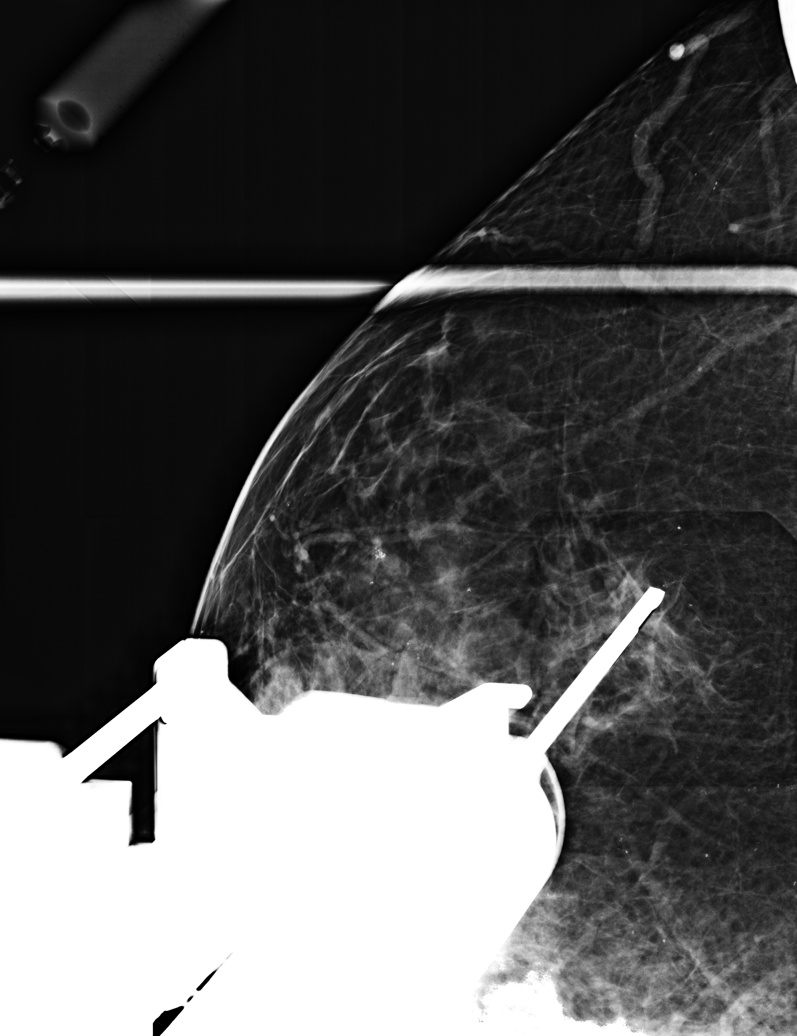

[R CC (3 of 3)]
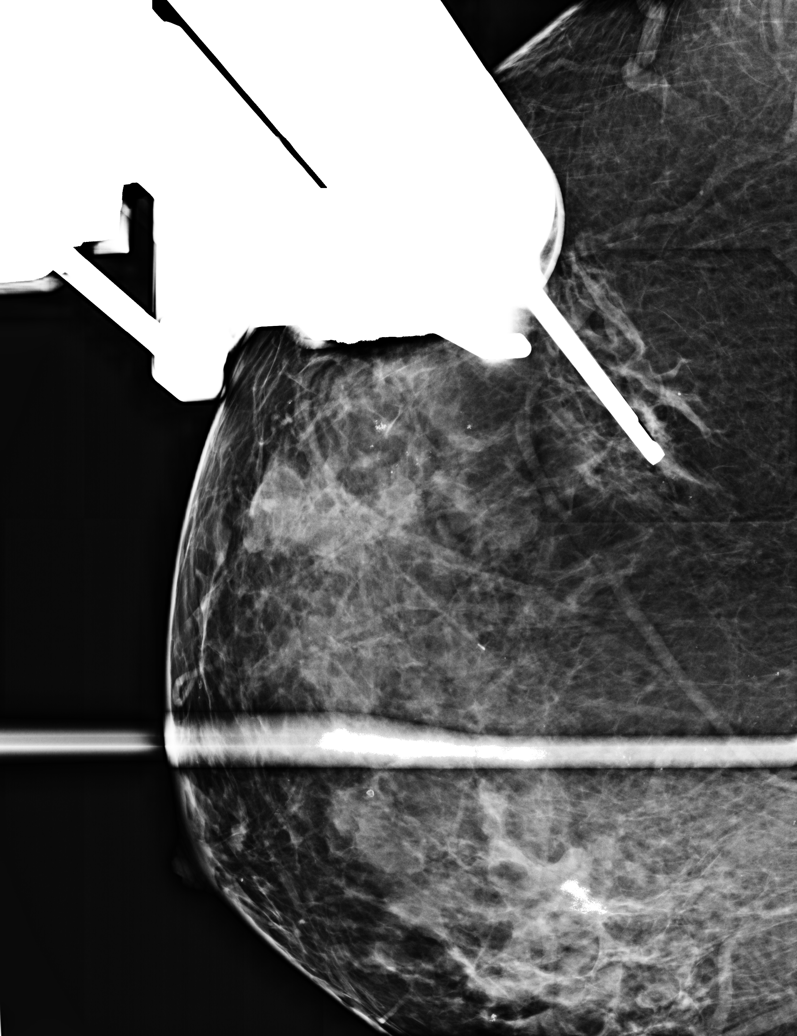

[R CC tomo (1 of 3) · tomo slice 39/77.0]
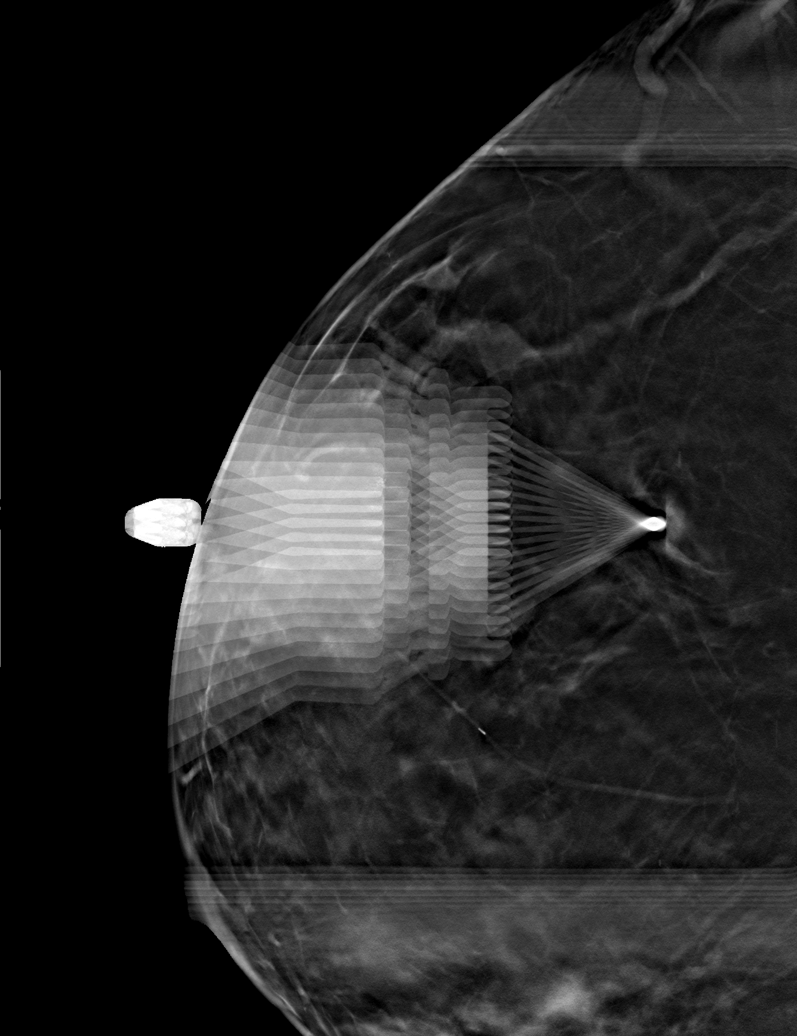

[R CC tomo (2 of 3) · tomo slice 39/76.0]
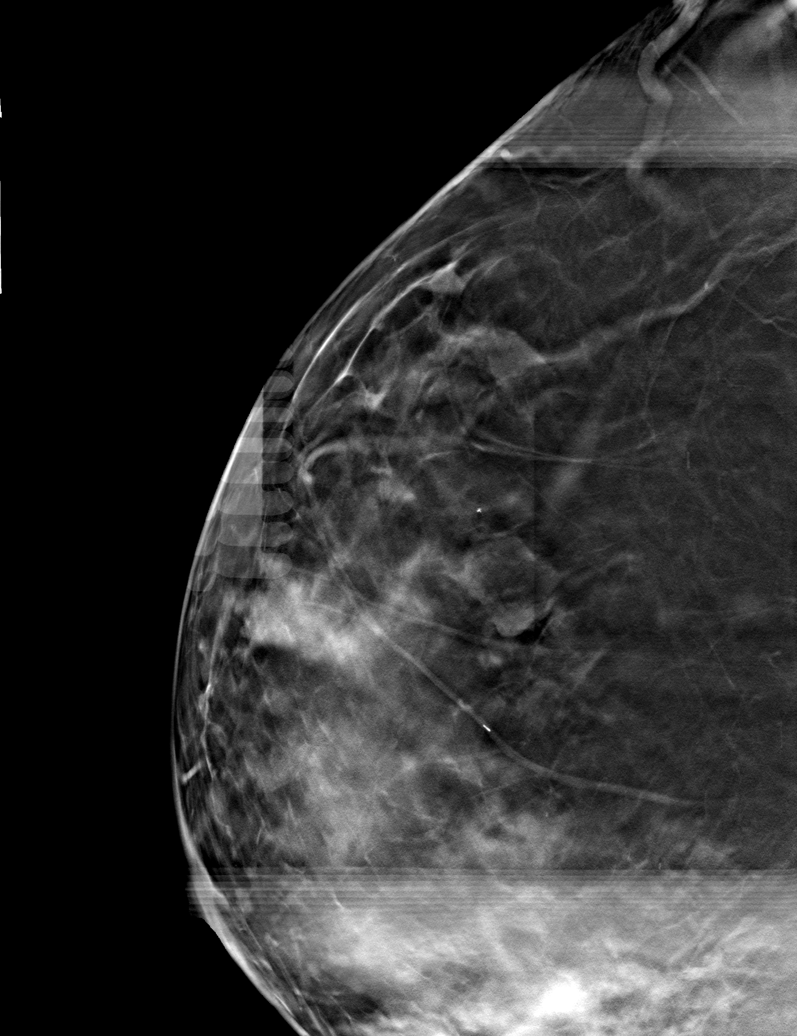

[R CC tomo (3 of 3) · tomo slice 39/77.0]
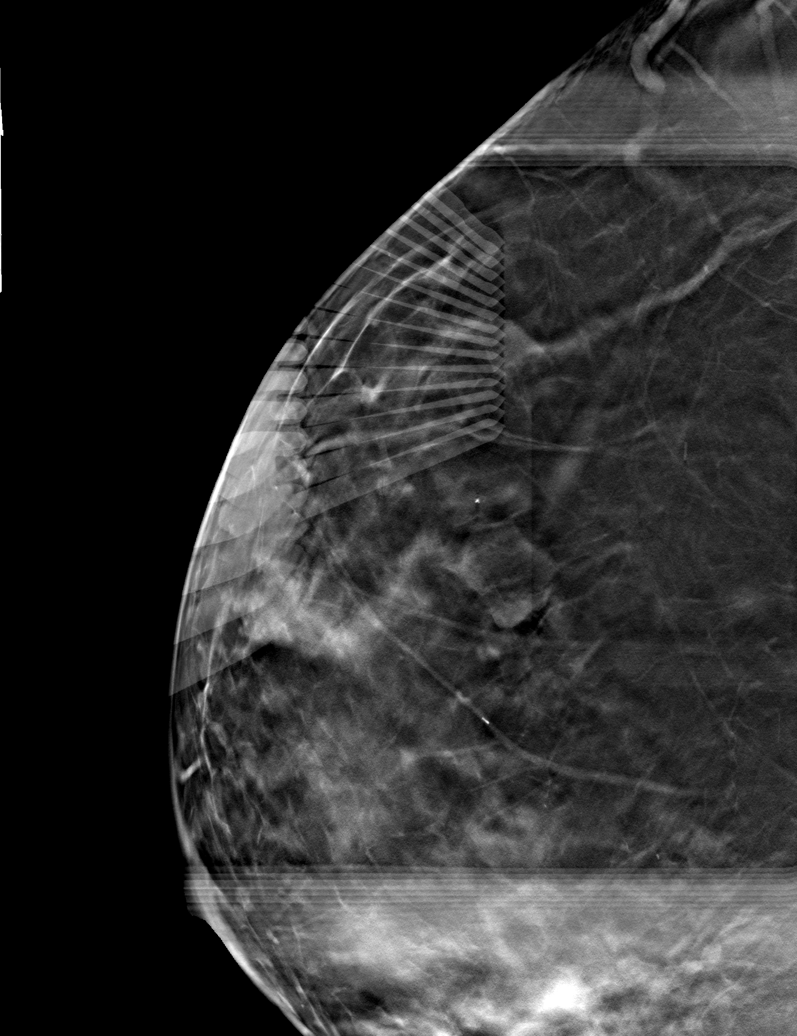

[6 of 18 positions shown; findings below may reference images not displayed]

PROCEDURE:

Patient consent: Prior to the procedure risks, complications, alternatives and a description of the procedure were discussed. All questions were answered.  Informed consent was obtained and signed.  

Procedure description:

RIGHT BREAST:

A stereotactic/tomosynthesis guided biopsy was performed for the 6 mm clustered calcifications in the right breast at 9 o'clock.  This was previously described in the prior mammogram report as located at 6 o'clock, however the location on today's imaging is confirmed at 9 o'clock. The skin was prepped in the usual manner. The local area was anesthetized with 1% lidocaine.  A skin nick was made in the breast.  The abnormality was approached superiorly using an upright digital mammography unit.  A 9-gauge Suros-Eviva vacuum-assisted biopsy needle was placed adjacent to the abnormality under computer guidance and confirmatory stereotactic mammography images were obtained to document needle placement.  Once the needle was documented to be in the correct location, multiple specimens were obtained. Specimen radiograph confirms calcifications within the specimen.  A clip was inserted into the biopsy cavity. Hemostasis was achieved with manual compression. Sterile skin strips were applied to the access site.

LEFT BREAST:

A stereotactic/tomosynthesis guided biopsy was performed for the 11 mm cluster of coarse heterogeneous calcifications in the left breast at 2 o'clock. The skin was prepped in the usual manner. The local area was anesthetized with 1% lidocaine.  A skin nick was made in the breast.  The abnormality was approached superiorly using an upright digital mammography unit.  A 9-gauge Suros-Eviva vacuum-assisted biopsy needle was placed adjacent to the abnormality under computer guidance and confirmatory stereotactic mammography images were obtained to document needle placement.  Once the needle was documented to be in the correct location, multiple specimens were obtained. Specimen radiograph confirms calcifications within the specimen.  A clip was inserted into the biopsy cavity. Hemostasis was achieved with manual compression. Sterile skin strips were applied to the access site.

Post procedure digital mammographic imaging demonstrates the left breast biopsy marker located approximately 2 cm superior from the biopsy cavity. The left breast biopsy clip is at the appropriate biopsy location. The specimens were sent to the laboratory for pathological analysis. The patient was given appropriate post care instructions and discharged in good condition. A female technologist was present throughout the procedure.
IMPRESSION: 1. Right breast: Stereotactic guided biopsy of the 6 mm cluster of calcifications in the right breast at 9 o'clock was successful with no apparent post procedure complications. The pathology requisition labels the calcifications at 6 o'clock, however post biopsy mammogram confirms that the calcifications are in fact located at 9 o'clock. Pathology indicates benign fibroadenoma with complications. Pathology results are concordant with imaging findings.  Recommend follow-up mammogram in 6 months to assess stability of multiple additional clusters of calcifications.

2. Left breast: Stereotactic guided biopsy of the 11 mm cluster of calcifications in the left breast at 3 o'clock was successful with no apparent post procedure complications.  Pathology indicates benign fibroadenoma with calcifications. Pathology results are concordant with imaging findings.  Recommend follow-up mammogram in 6 months to confirm stability.

The biopsy results and follow-up recommendations were discussed with the patient.

## 2021-02-06 IMAGING — MG MAMMO DIAG BIL W/CAD TOMO
9 of 13 series · 9 of 29 positions shown · non-contrast
Comparison: Multiple prior studies the most recent dated 05/31/2020

INDICATION: Follow-up after bilateral biopsies of calcifications yielding fibroadenomas
TECHNIQUE: Bilateral 2-D digital diagnostic mammogram was performed followed by 3-D tomosynthesis. Current study was also evaluated with a computer aided detection (CAD) system.

[L CC (1 of 2)]
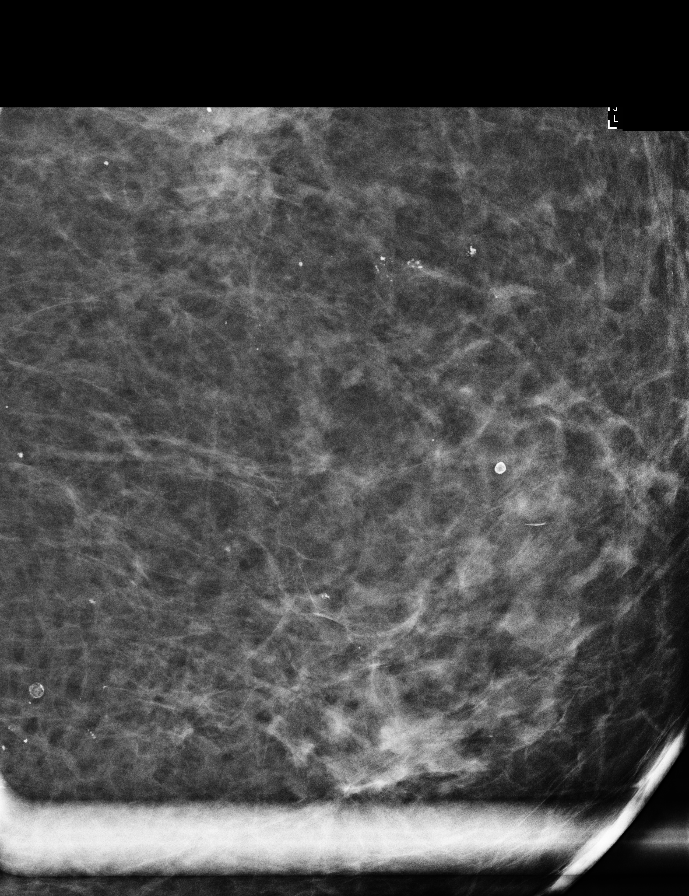

[R CC (1 of 2)]
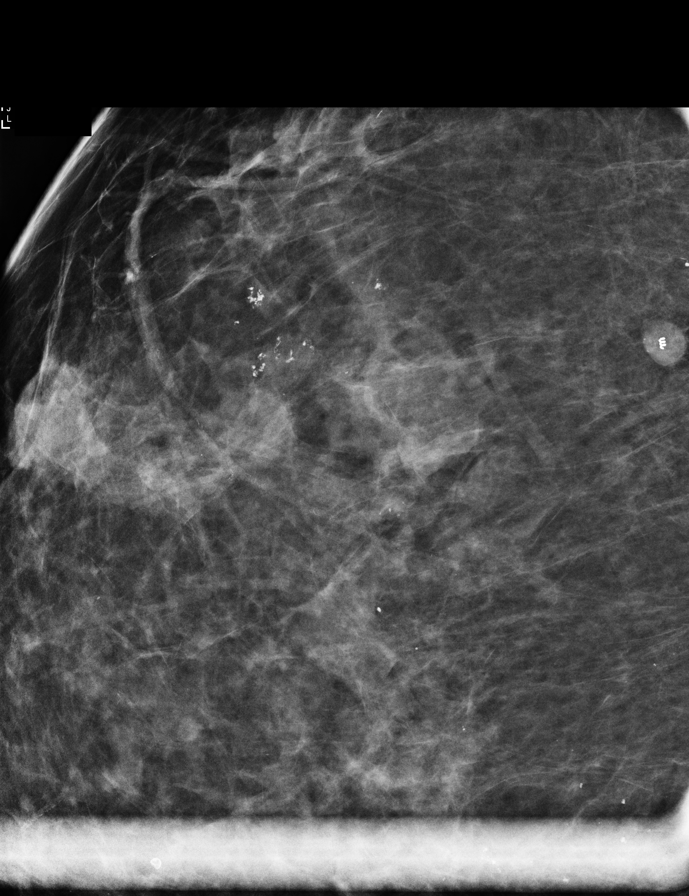

[R ML (1 of 2)]
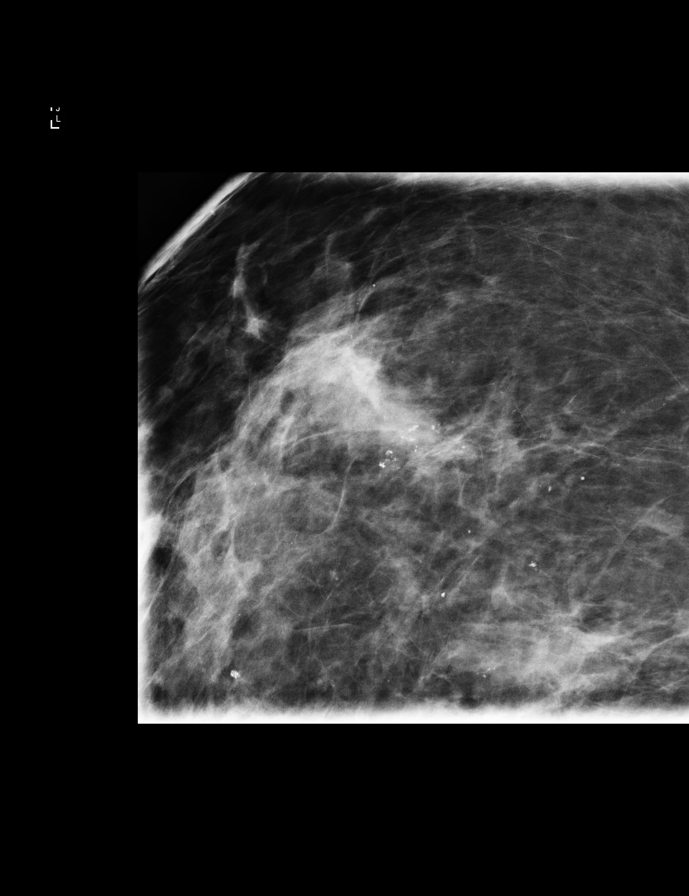

[L ML]
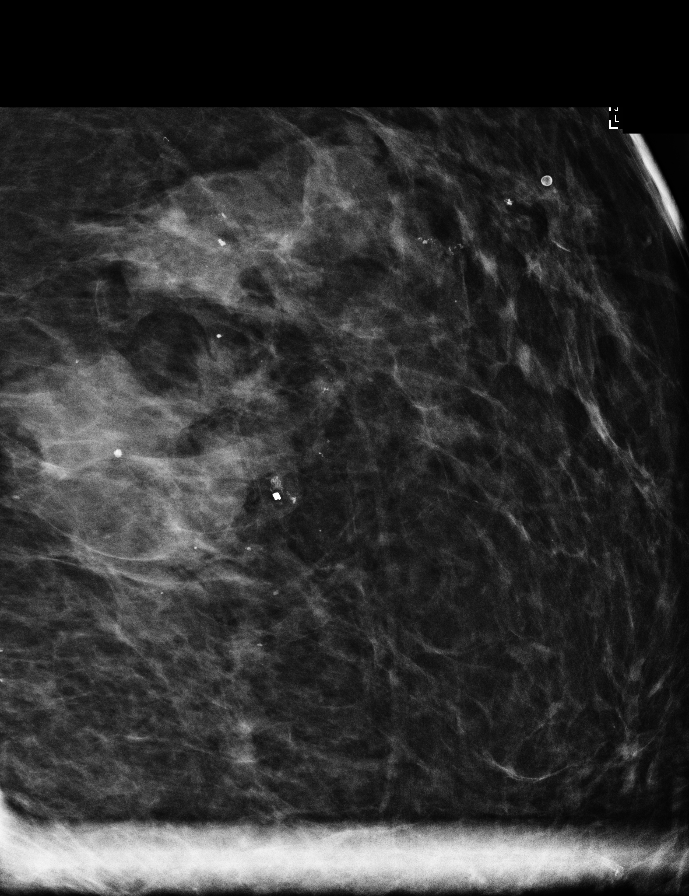

[R ML (2 of 2)]
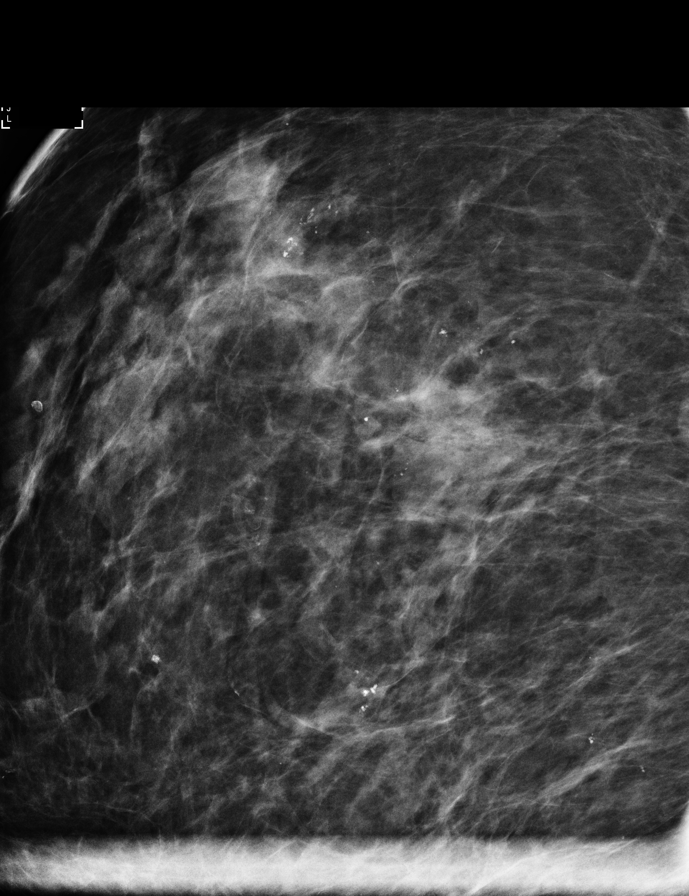

[L CC (2 of 2)]
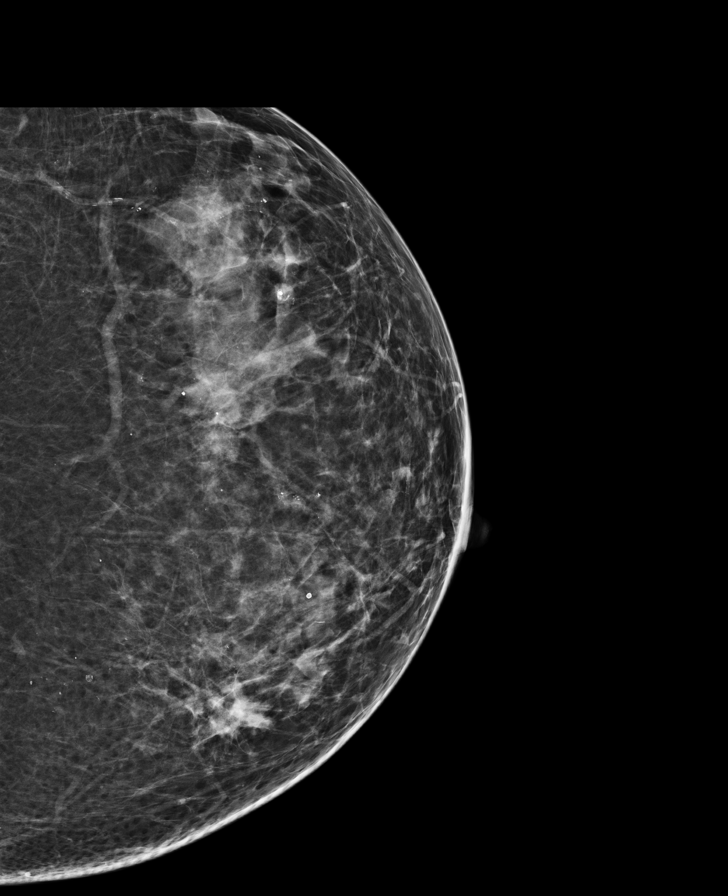

[R MLO]
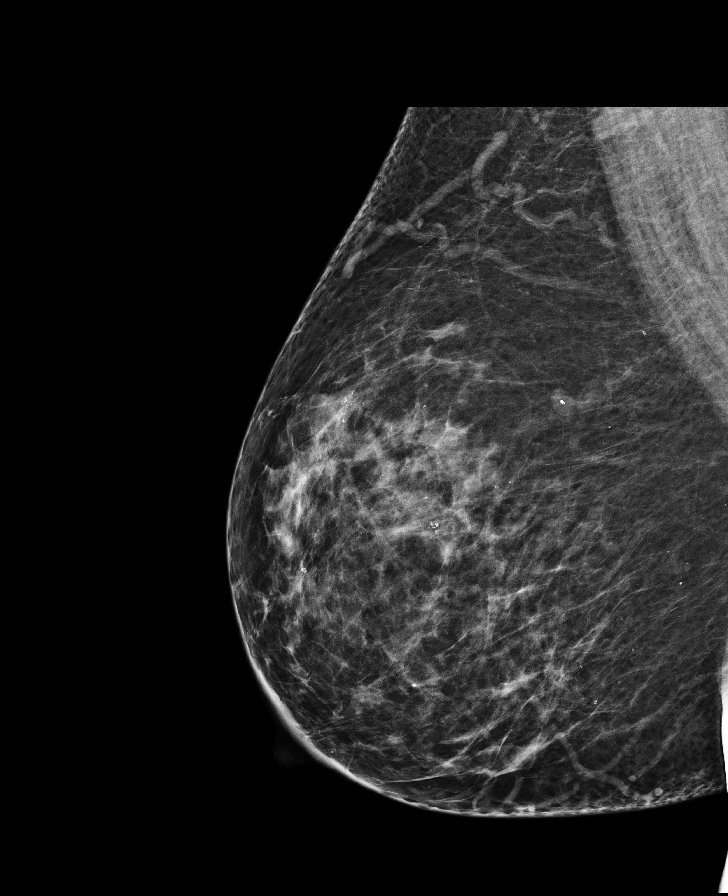

[R CC (2 of 2)]
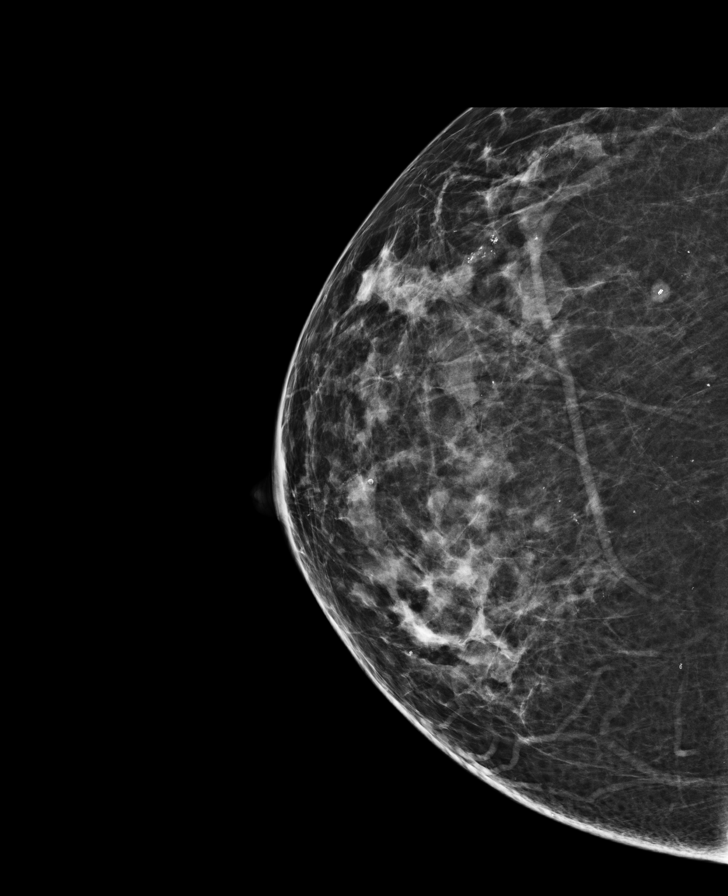

[L MLO]
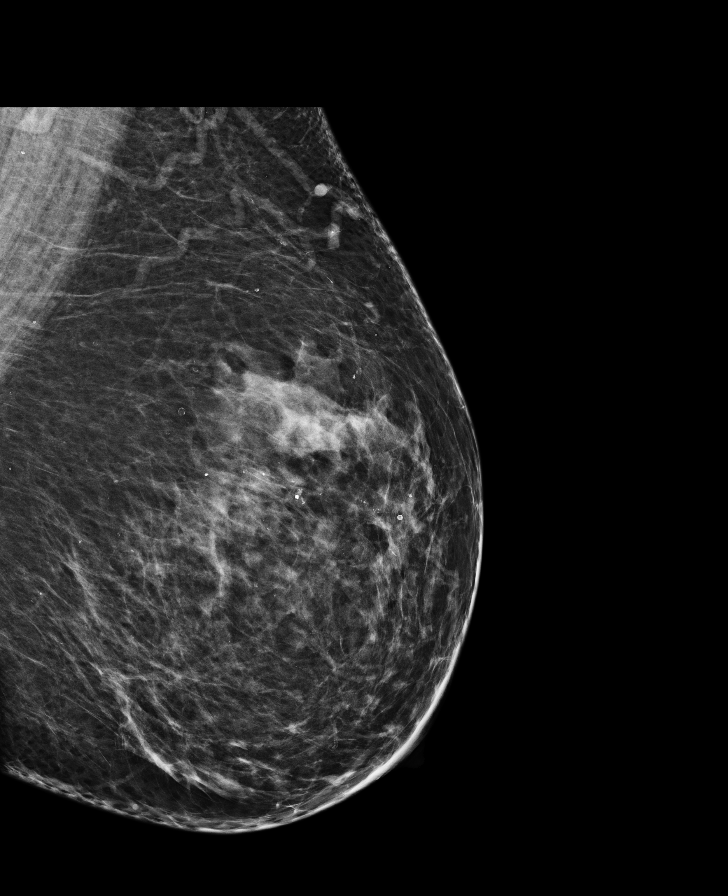

[9 of 29 positions shown; findings below may reference images not displayed]

FINDINGS: The breasts are heterogeneously dense, which may obscure small masses.   There are multiple similar appearing groups of coarse calcifications in both breasts, two of which are biopsy-proven benign fibroadenomas.
IMPRESSION: Multiple similar-appearing groups of calcifications in both breasts are probably benign. Six-month follow-up bilateral mammogram is recommended to demonstrate stability.

The patient received a copy of the results at the end of the examination.

FINAL ASSESSMENT: BI-RADS: Category 3 Probably benign

## 2021-08-02 IMAGING — US US GALLBLADDER
1 series · 14 of 17 positions shown · non-contrast
Comparison: None.

HISTORY: 50-year-old female with calculus of gallbladder with other cholecystitis without obstruction.
TECHNIQUE: Using real-time and Doppler ultrasound, the gallbladder was evaluated.

[Series 1: us gallbladder · 14 of 17 slices shown]
[im 1/17]
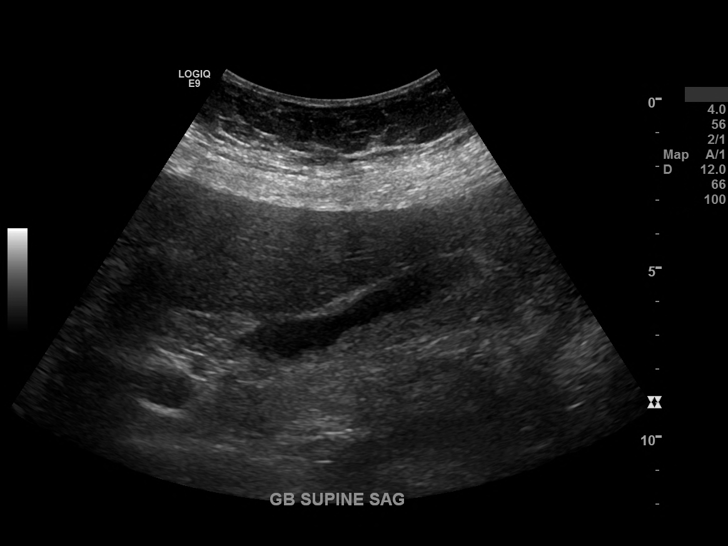
[im 2/17]
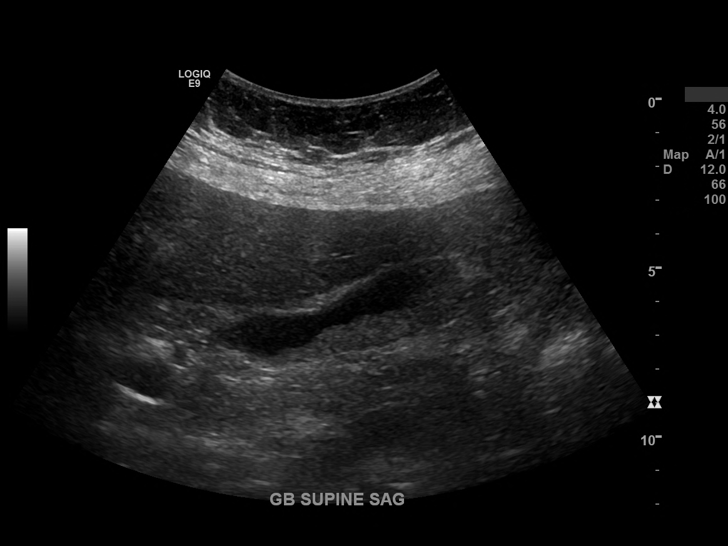
[im 4/17]
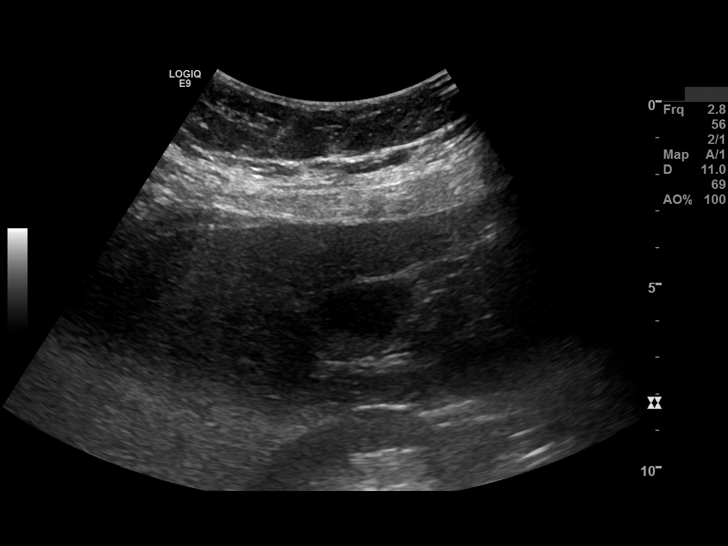
[im 5/17]
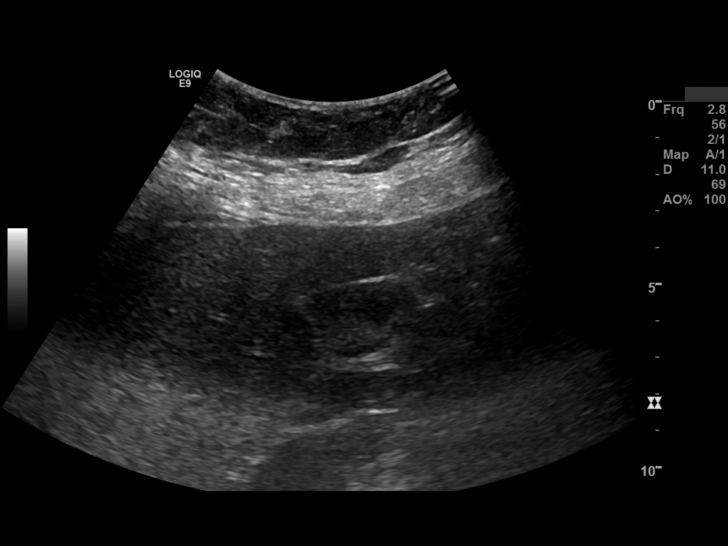
[im 6/17]
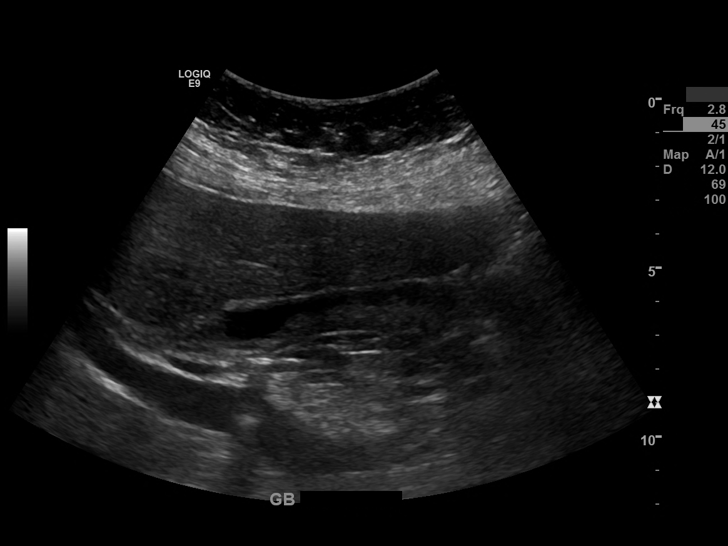
[im 7/17]
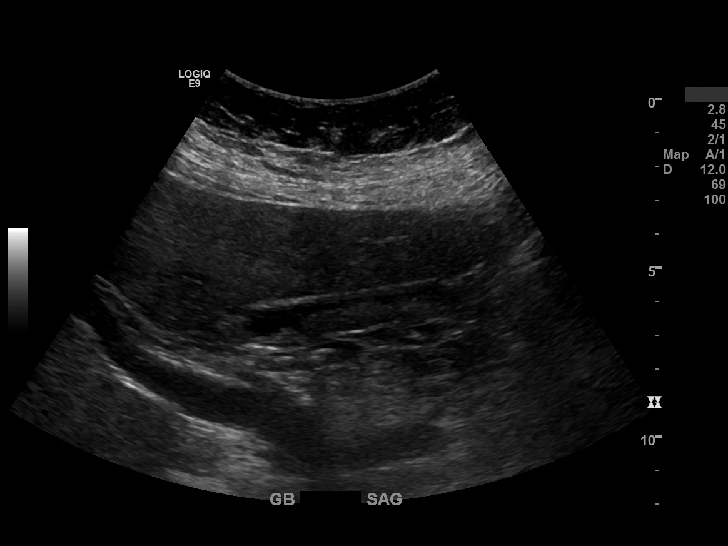
[im 8/17]
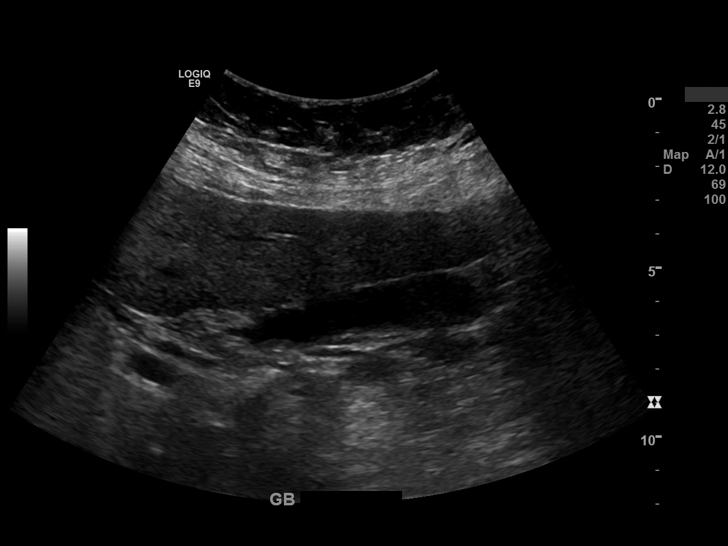
[im 10/17]
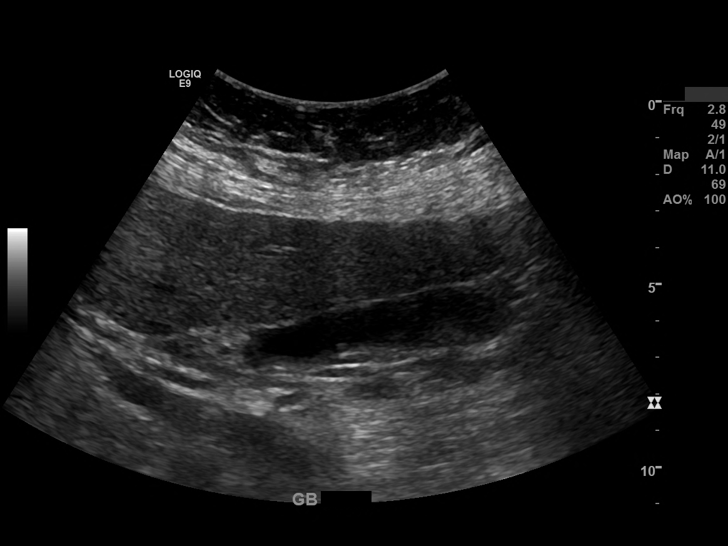
[im 11/17]
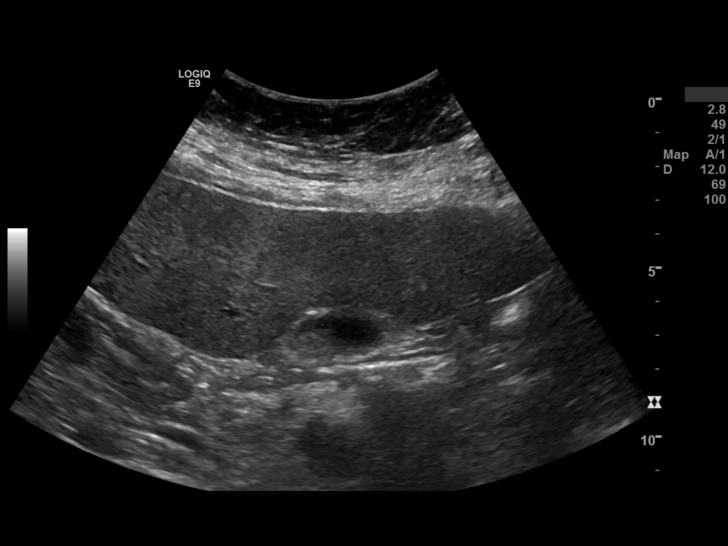
[im 12/17]
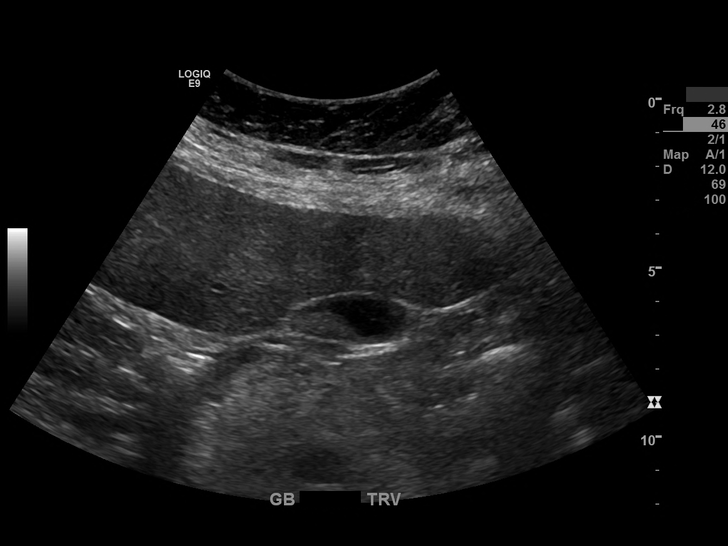
[im 13/17]
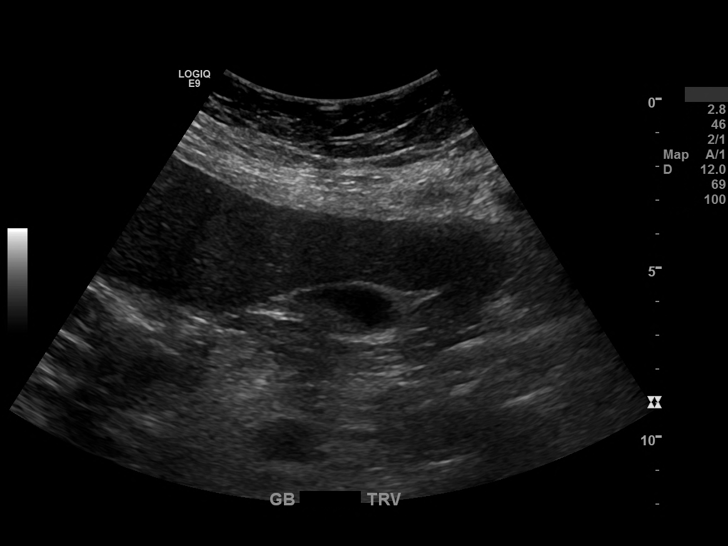
[im 14/17]
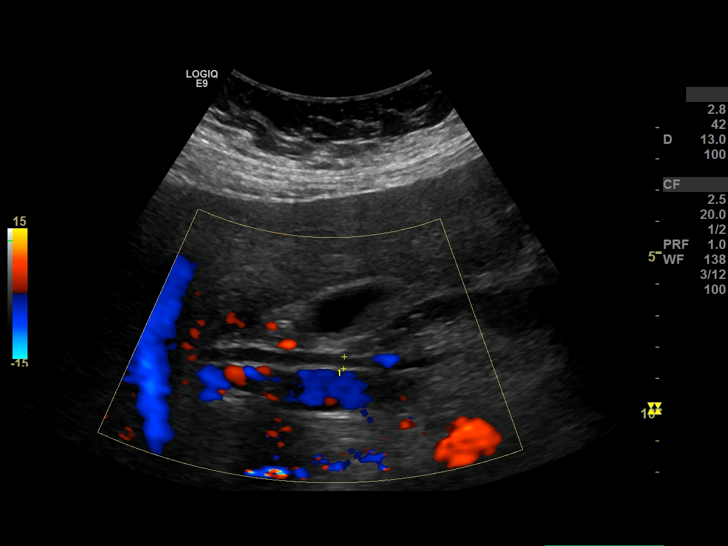
[im 16/17]
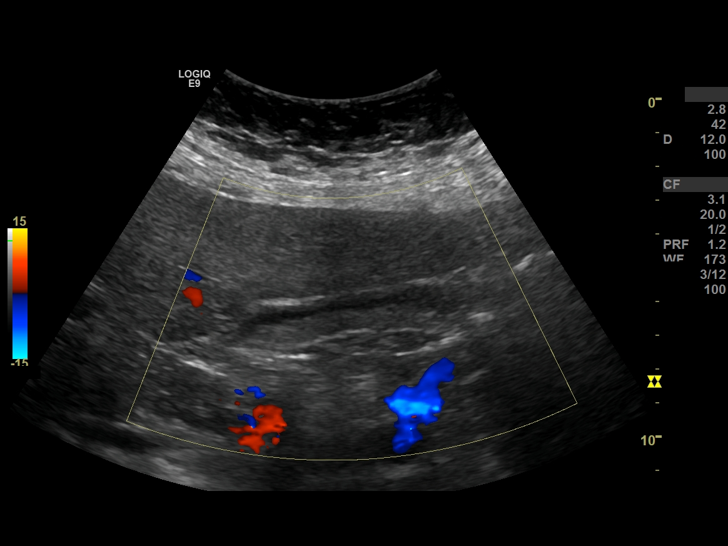
[im 17/17]
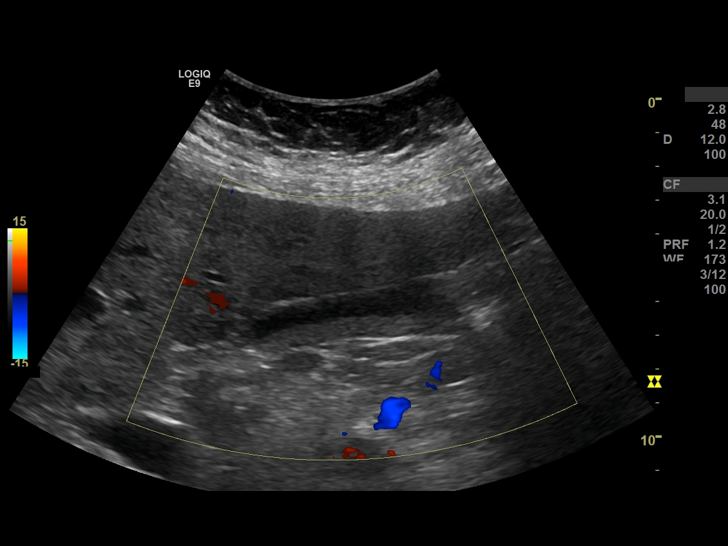

[14 of 17 positions shown; findings below may reference images not displayed]

FINDINGS: Gallbladder: Sludge in the gallbladder. No stones. The gallbladder wall thickness measures 1.9 mm. There is a  negative sonographic Murphy's sign. The common bile duct measures 3.80  mm.
IMPRESSION: Gallbladder sludge with no evidence of acute cholecystitis.

## 2021-08-10 IMAGING — US US PANCREAS
1 series · 14 of 25 positions shown · non-contrast
Comparison: None.

HISTORY: 50-year-old female with abnormal results of function studies of other organs and systems.
TECHNIQUE: Using real-time and Doppler ultrasound, the pancreas was evaluated.

[Series 1: us pancreas · 14 of 29 slices shown]
[im 1/29]
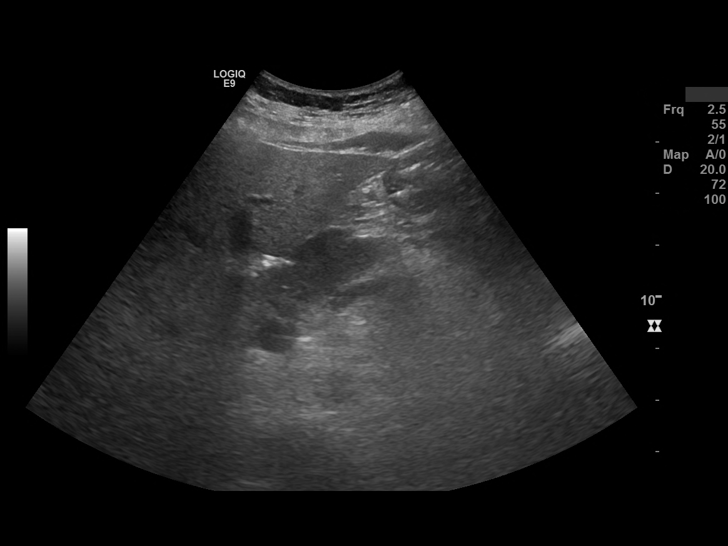
[im 3/29]
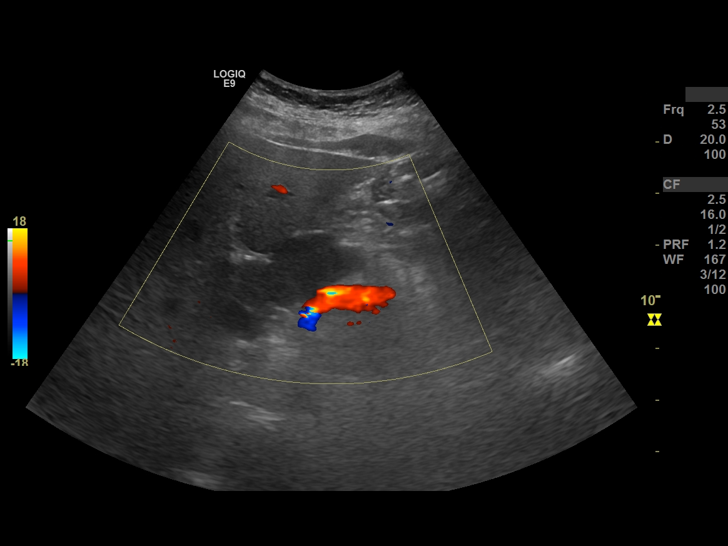
[im 5/29]
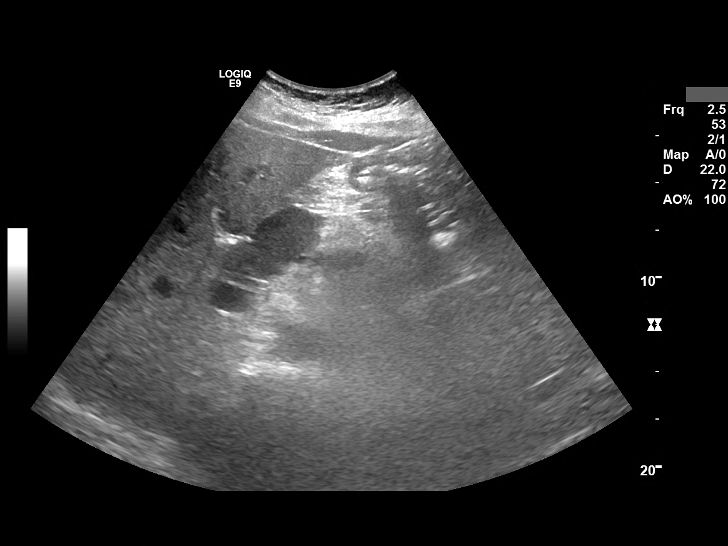
[im 8/29]
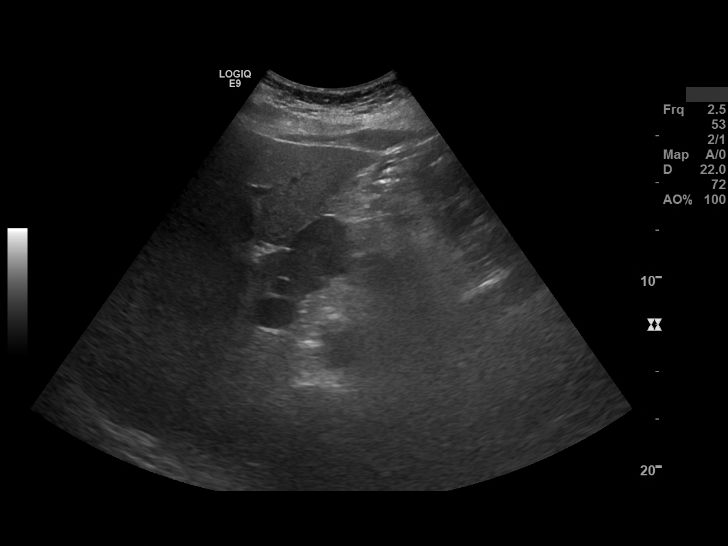
[im 10/29]
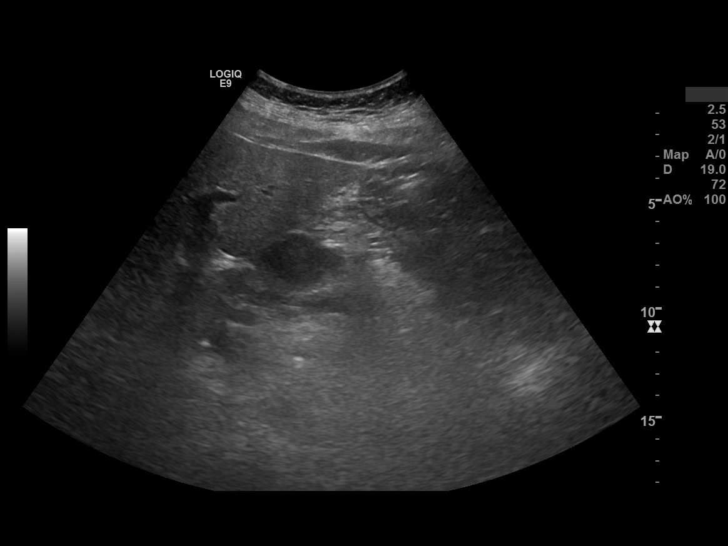
[im 11/29]
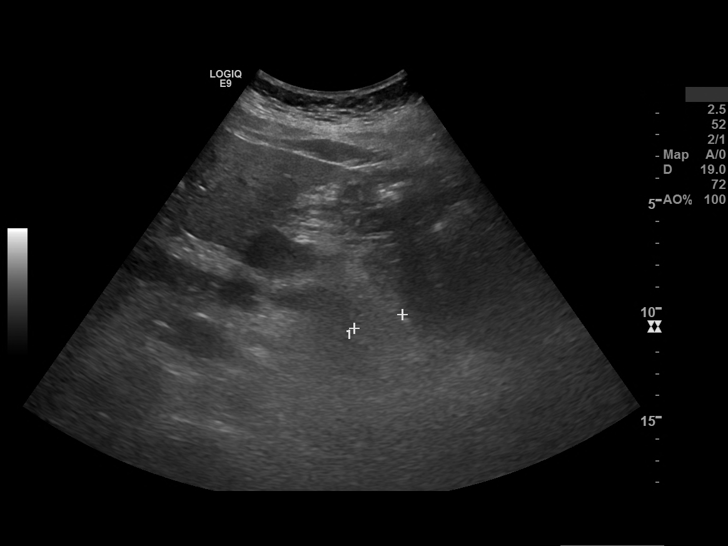
[im 13/29]
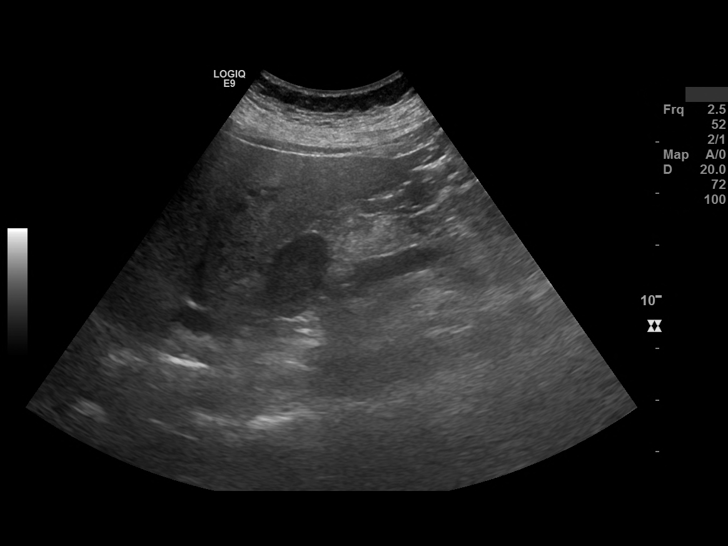
[im 16/29]
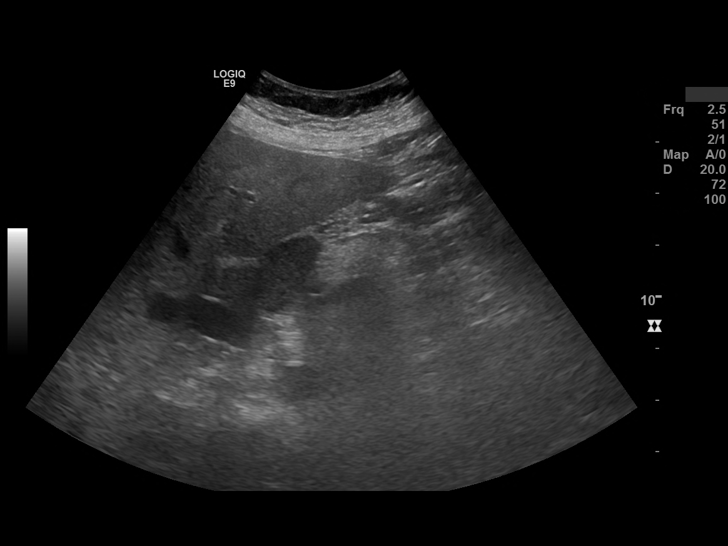
[im 18/29]
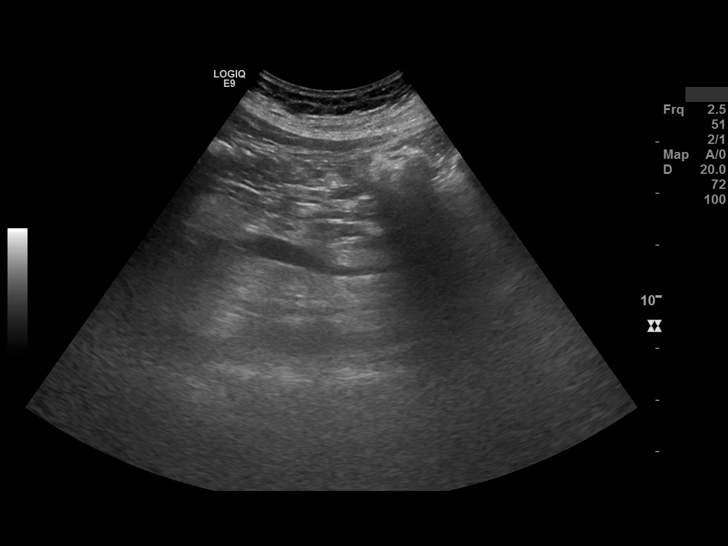
[im 19/29]
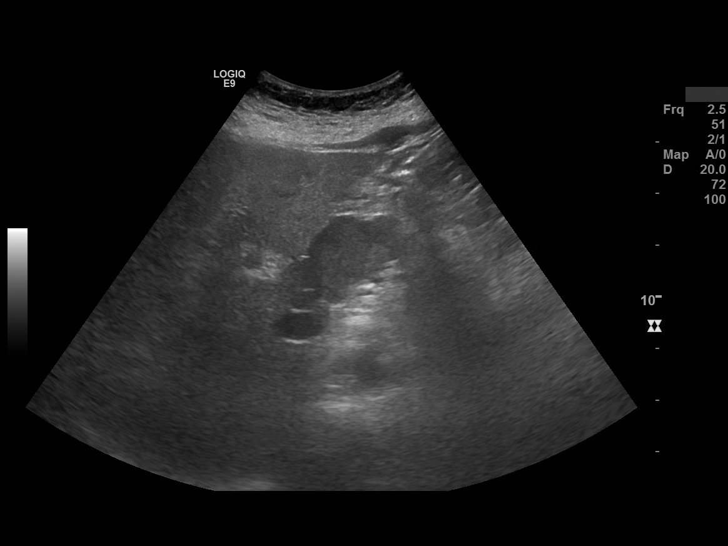
[im 22/29]
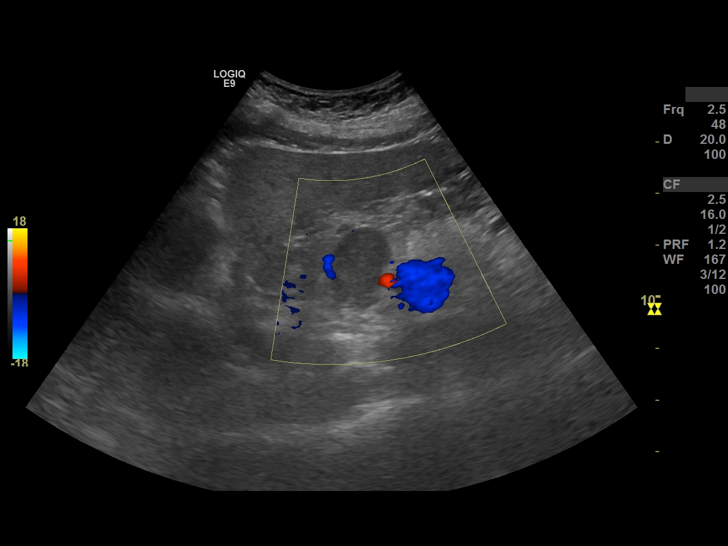
[im 24/29]
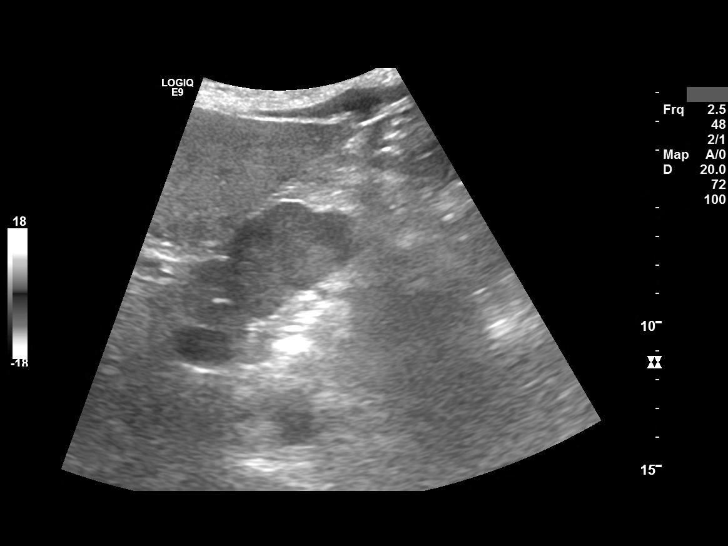
[im 26/29]
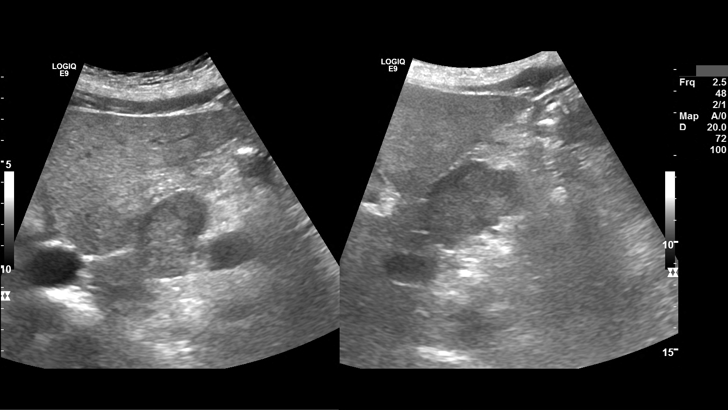
[im 29/29]
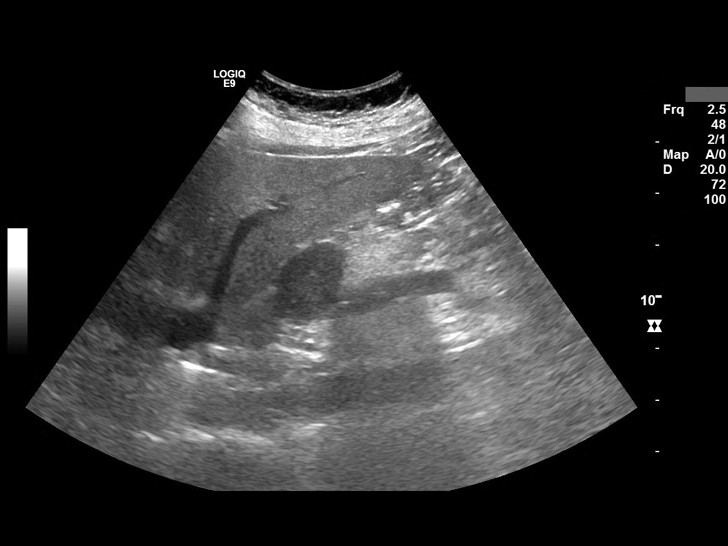

[14 of 25 positions shown; findings below may reference images not displayed]

FINDINGS: 43 x 29 x 51 mm hypoechoic mass is identified posterior to the left lobe of the liver at the level of the pancreatic head.
IMPRESSION: 43 x 29 x 51 mm hypoechoic mass identified posterior to the left lobe of the liver at the level of the pancreatic head. Additional MRI/MRCP abdomen study is recommended.

## 2021-08-10 IMAGING — MG MAMMO DIAG BIL W/CAD TOMO
8 of 12 series · 8 of 28 positions shown · non-contrast
Comparison: The present examination has been compared to prior imaging studies.

HISTORY: Patient is 50 years old and is seen for diagnostic evaluation of calcifications in both breasts. The patient has no personal history of cancer. The patient has the following family history of breast cancer:  mother, at age 61, breast cancer.
TECHNIQUE: Bilateral 2-D digital diagnostic mammogram was performed followed by 3-D tomosynthesis.  Current study was also evaluated with a computer aided detection (CAD) system.

[R CC (1 of 2)]
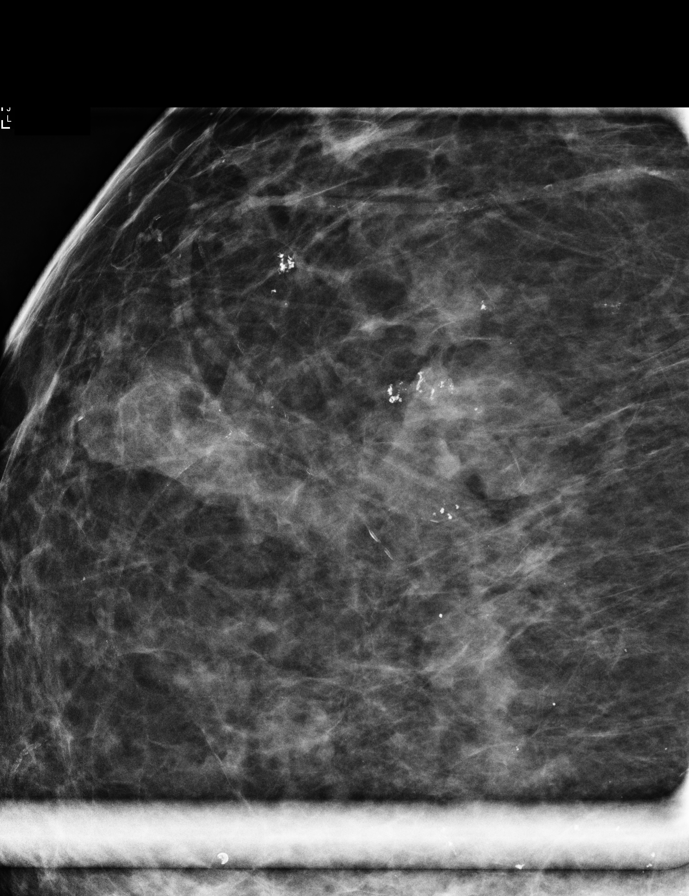

[L ML]
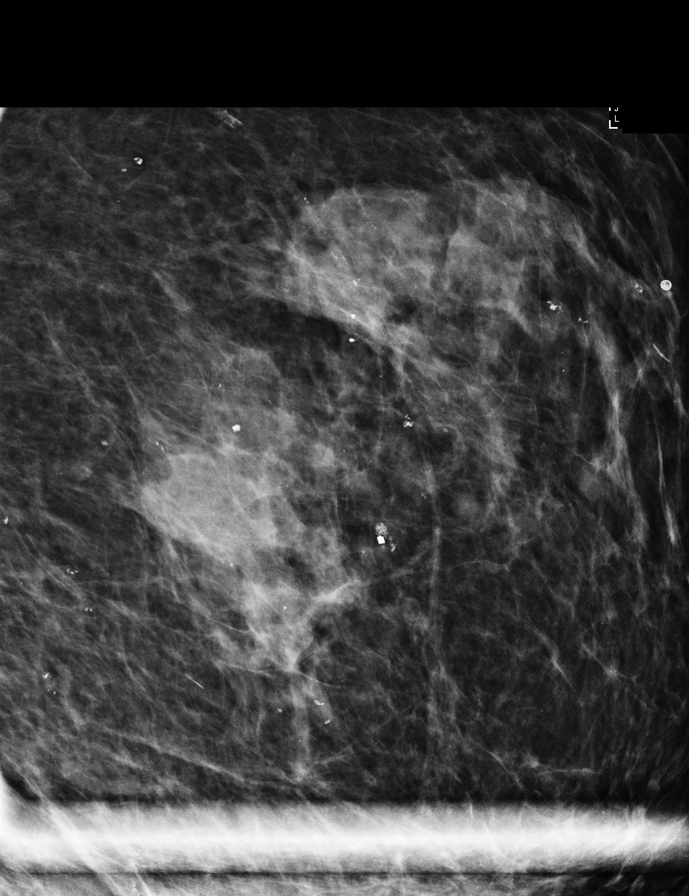

[L CC (1 of 2)]
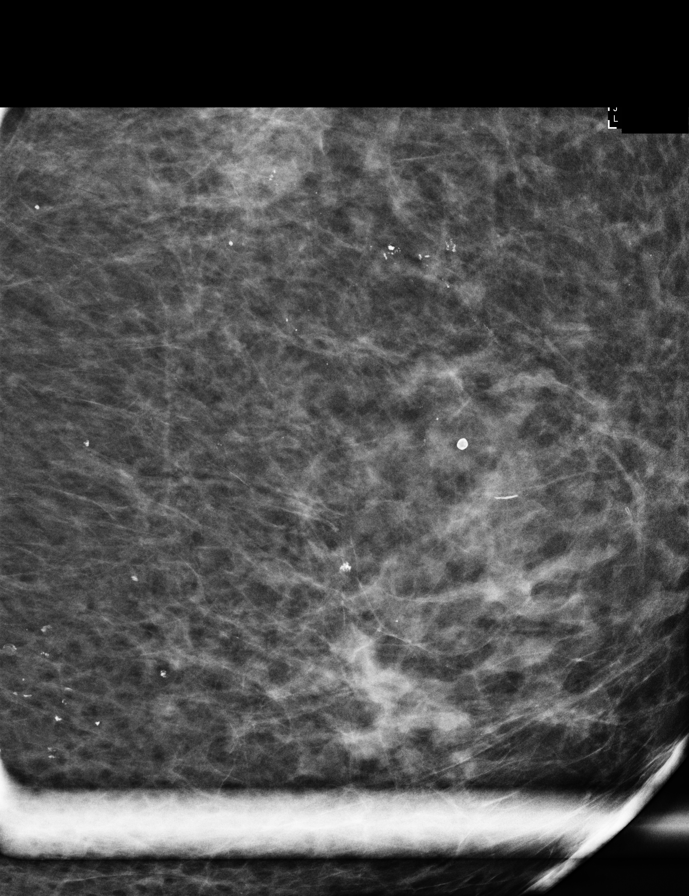

[R ML]
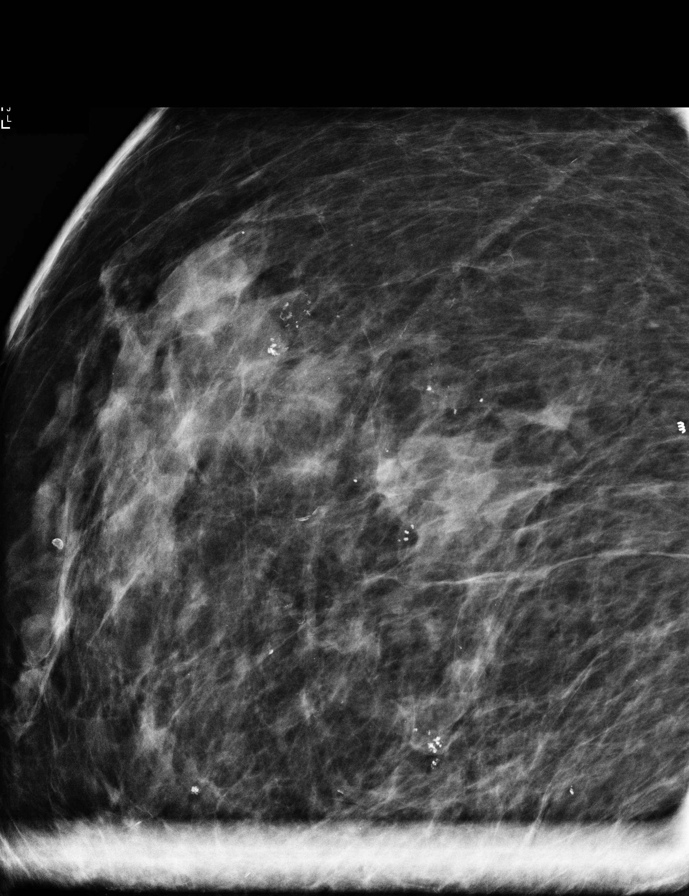

[R CC (2 of 2)]
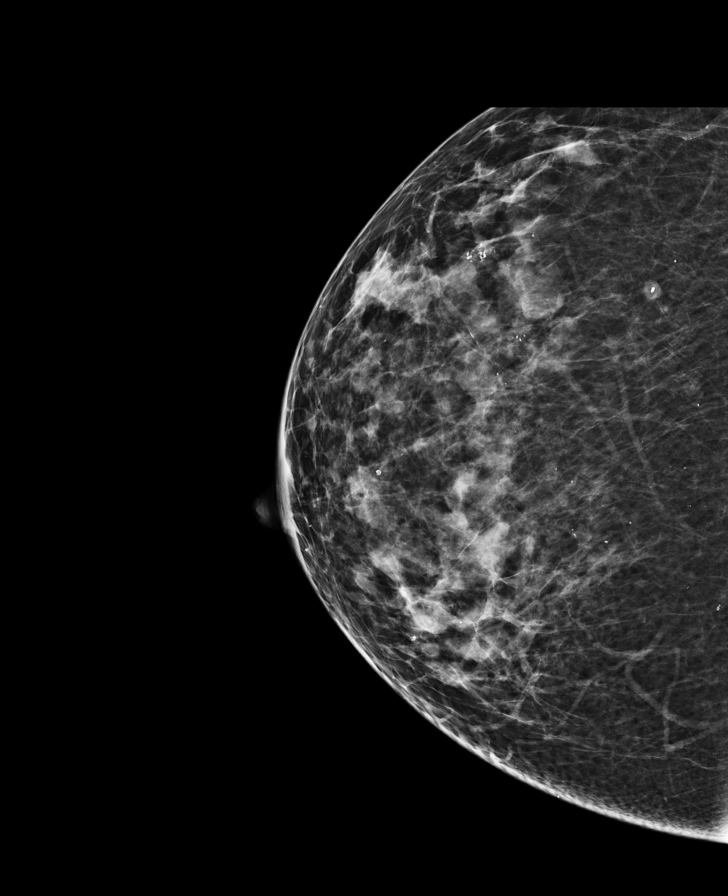

[L MLO]
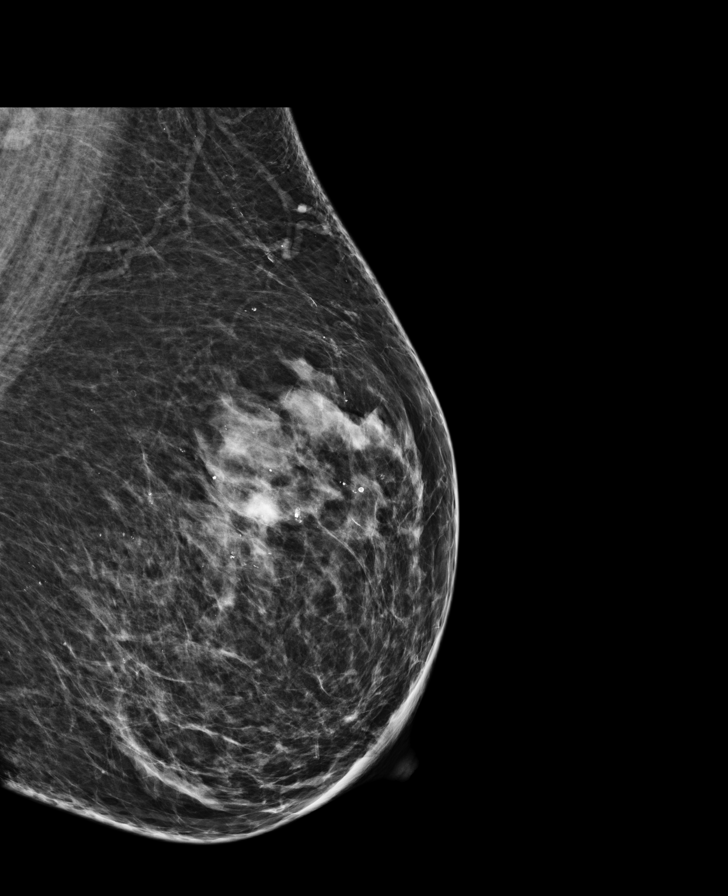

[R MLO]
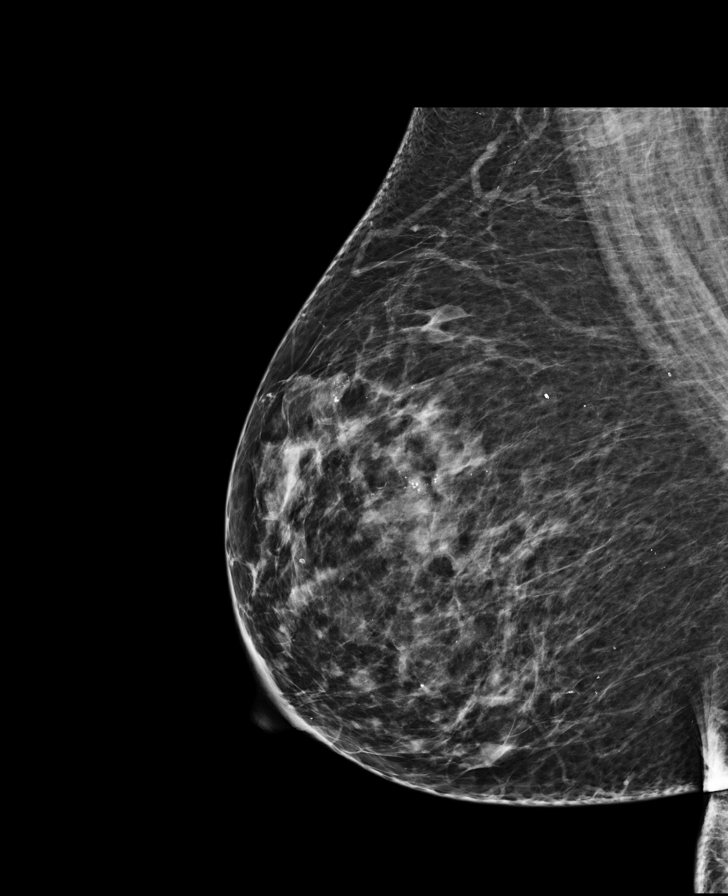

[L CC (2 of 2)]
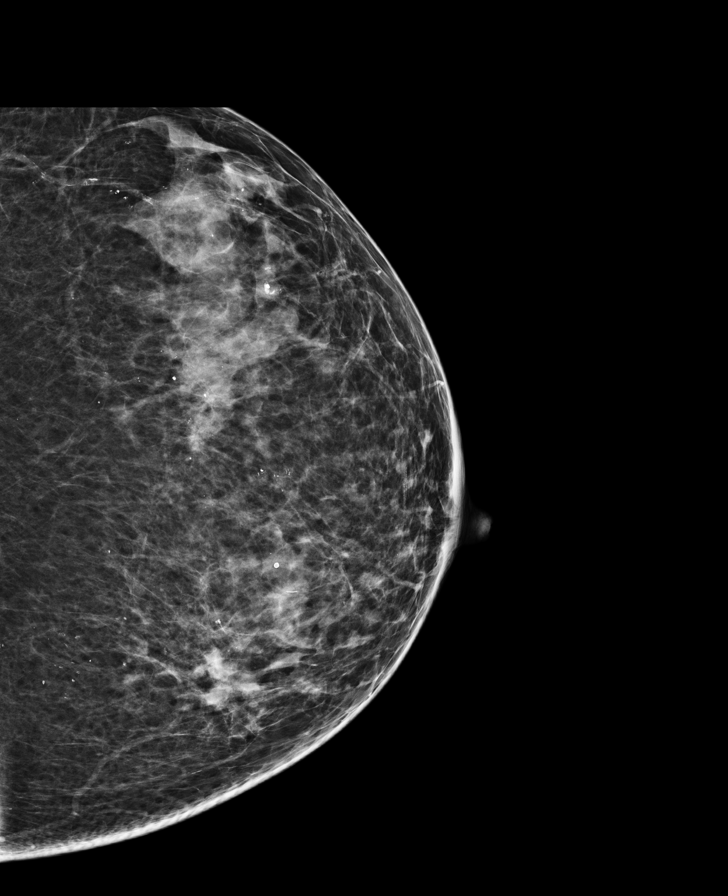

[8 of 28 positions shown; findings below may reference images not displayed]

MAMMOGRAM FINDINGS:

The breasts are heterogeneously dense, which may obscure small masses. There are biopsy site markers in each breast.

There are multiple similar groups of stable coarse calcifications in both breasts. Previous biopsy of some of these calcifications revealed benign fibroadenomas.
IMPRESSION: Multiple similar appearing stable groups of coarse calcifications in both breasts are probably benign and likely related to fibroadenomas and/or fibroadenomatoid type change. Follow-up bilateral diagnostic mammogram in 6 months is recommended.

The patient was given a copy of her results at the time of her visit.

BI-RADS Category 3: Probably benign

## 2021-08-30 IMAGING — CT CT THORAX WO CONTRAST
2 of 4 series · 13 of 36 positions shown, 16 images · non-contrast
Comparison: 02/06/2019 and 02/04/2019.

CORONARY ARTERY CALCIFICATION: Present, mild.

HISTORY: Other malignant neuroendocrine tumors.  History of thyroid and uterine cancer.  Non-smoker.
TECHNIQUE: Axial CT images of the chest were obtained from the lung apices through the lung bases without intravenous contrast. Dose reduction technique used: Automated exposure control and adjustment of the mA and/or kV according to patient size. CT Studies and Cardiac Nuclear Medicine Studies in last 12-months = 2

[Series 2: soft tissue · axial · 0.66mm/px · z∈[+1536,+1781]mm · 10 of 59 slices shown, 13 images]
[im 5/59  mediastinal]
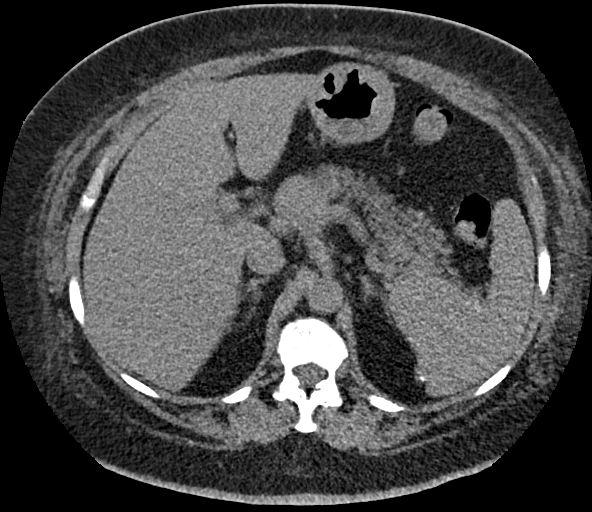
[im 5/59  lung]
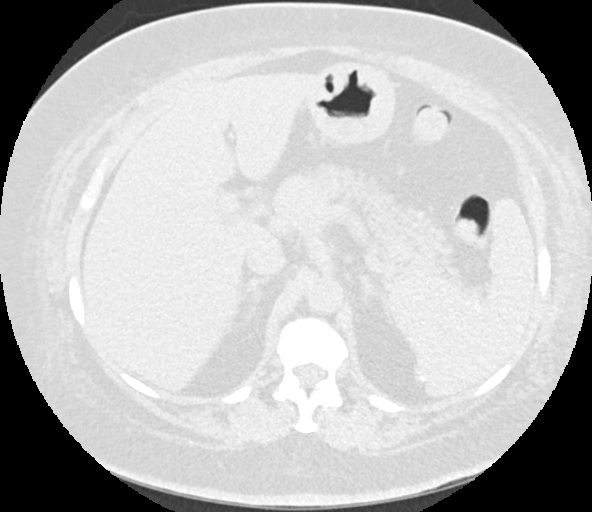
[im 9/59  lung]
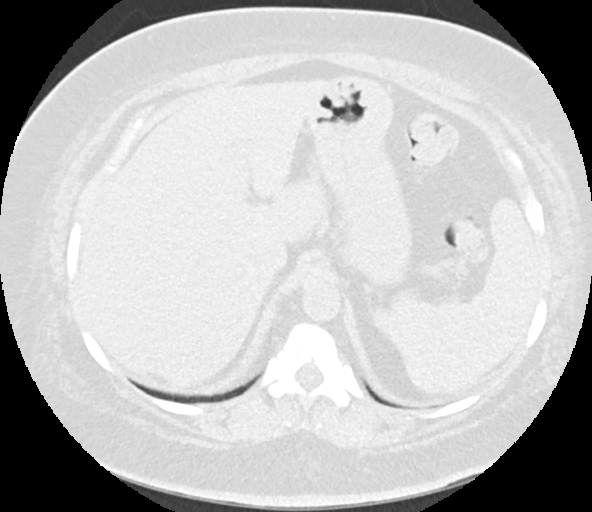
[im 16/59  lung]
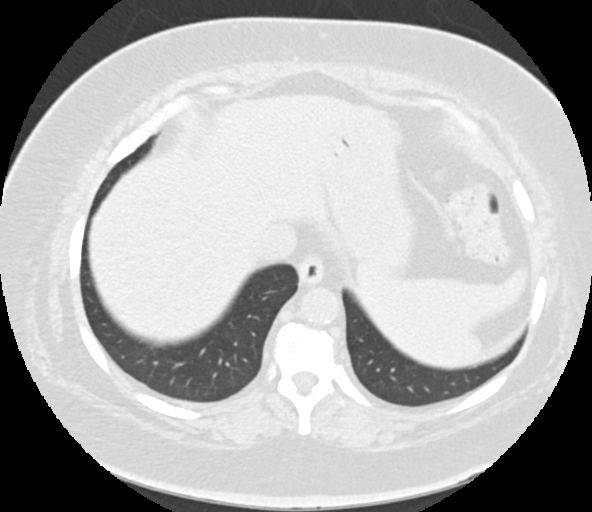
[im 21/59  lung]
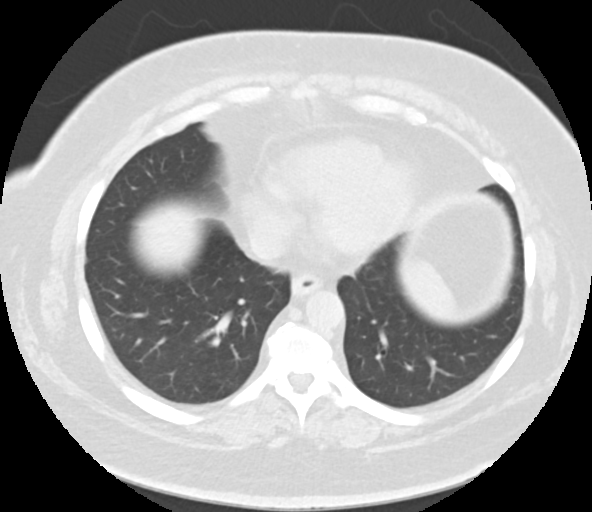
[im 27/59  mediastinal]
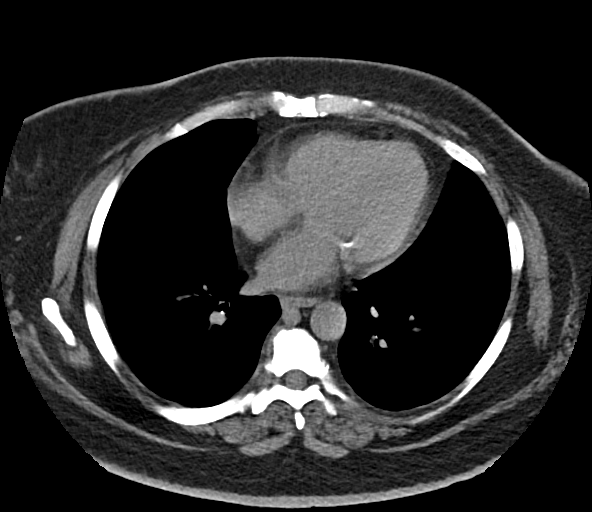
[im 27/59  lung]
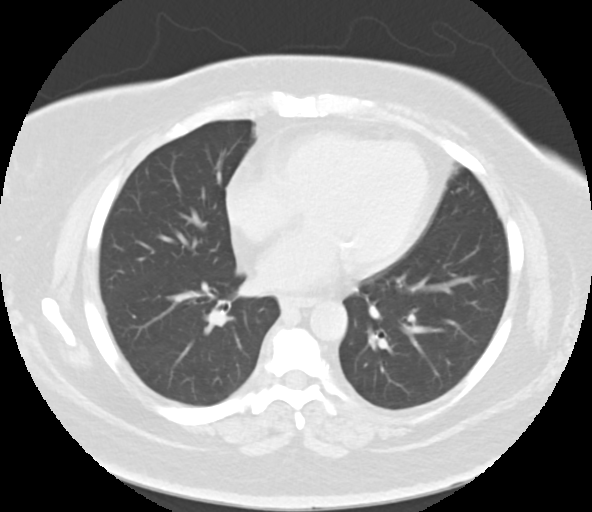
[im 32/59  lung]
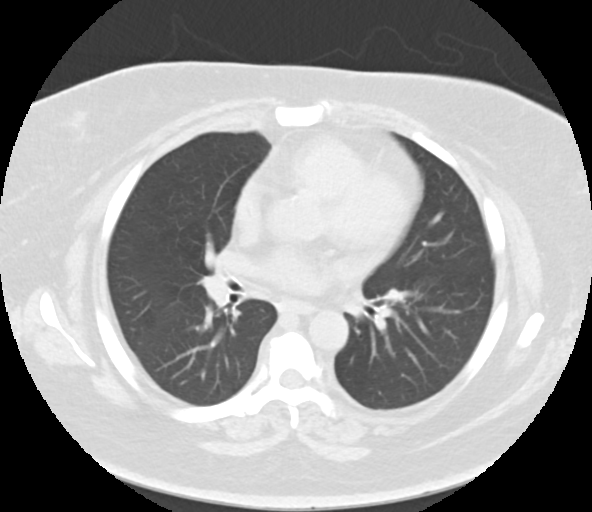
[im 38/59  lung]
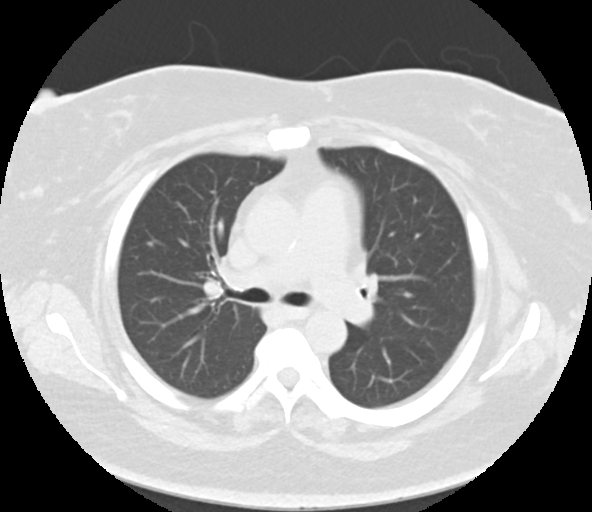
[im 43/59  lung]
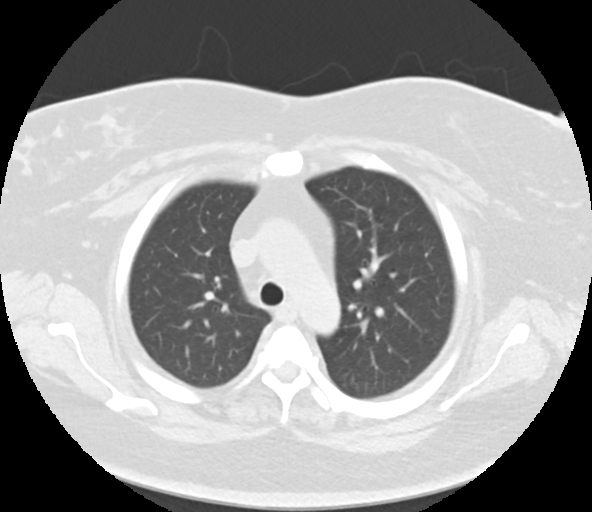
[im 50/59  mediastinal]
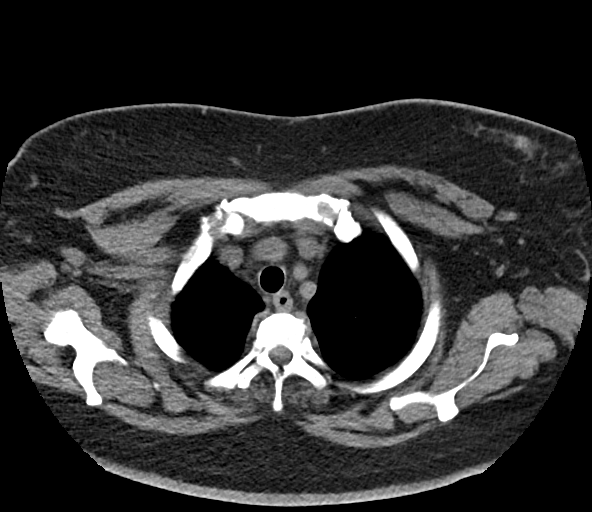
[im 50/59  lung]
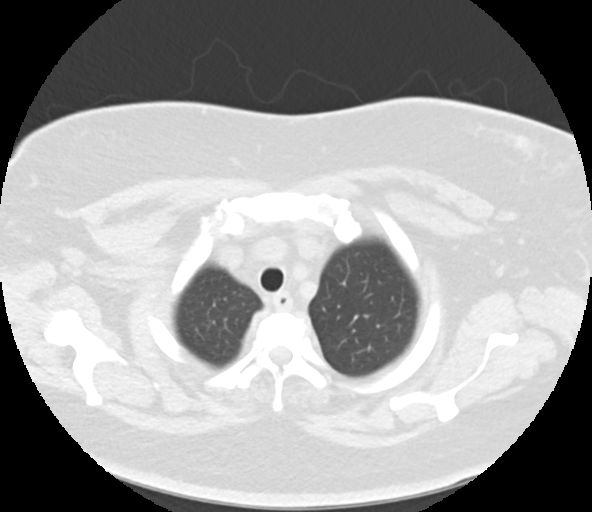
[im 54/59  lung]
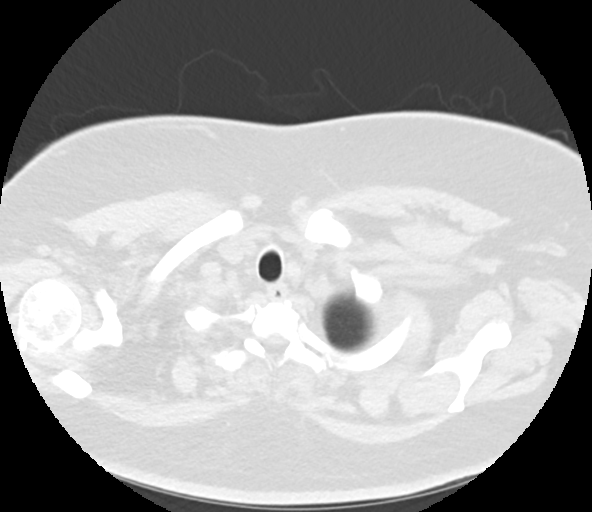

[Series 4: coronal · coronal · 0.58mm/px · 3 of 67 slices shown]
[im 14/67  lung]
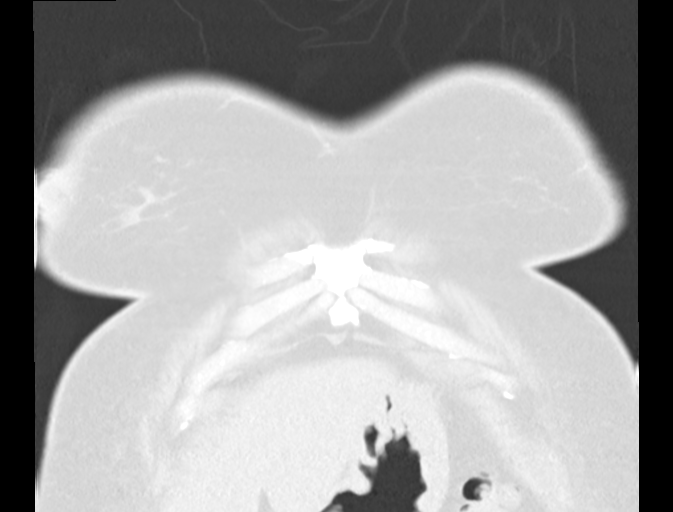
[im 27/67  lung]
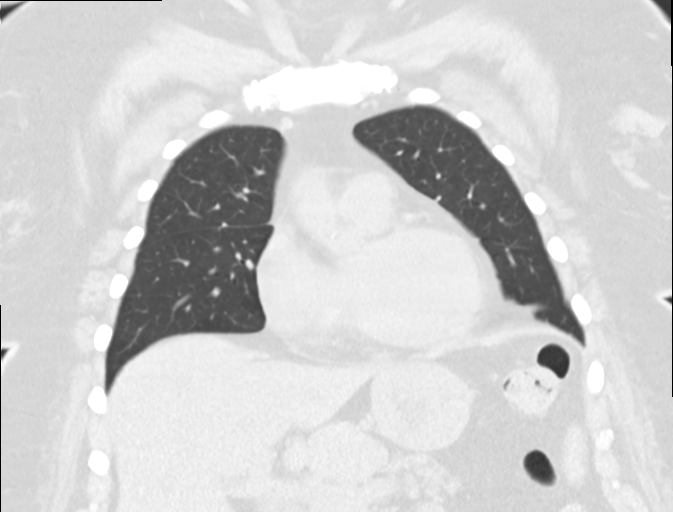
[im 40/67  lung]
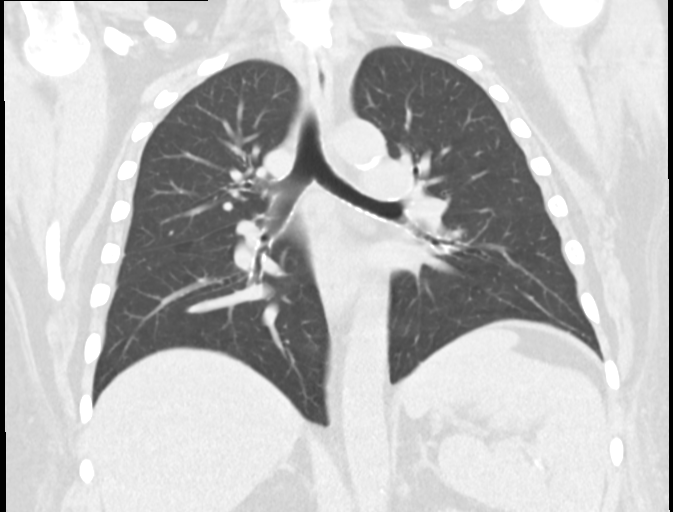

[13 of 36 positions shown; findings below may reference images not displayed]

FINDINGS: The central airways are patent.

The lungs do not show any focal infiltrate or consolidation. The interstitium is normal. In the left upper lung, there is a 4 mm subpleural nodule in the paraspinous region, axial image 37. This is grossly unchanged compared to the previous exam.

There are no pleural effusions.

The heart is normal in size without significant pericardial effusion.

There is no mediastinal, hilar, or axillary lymphadenopathy.

Limited evaluation of the upper abdomen is unremarkable.

Bones are prominently dense. This is unchanged from previous exam.
IMPRESSION: 1. Stable 4 mm left upper lobe subpleural pulmonary nodule.

2. Dense bones may be due to metabolic abnormality.

Total radiation dose to patient is CTDIvol 4.69 mGy and DLP 154.00 mGy-cm.

## 2021-08-31 IMAGING — MR MRI ABDOMEN WO/W CONTRAST
11 series · 48 of 48 positions shown · IV contrast (20cc prohance)
Comparison: MRI pelvis study from the same day, CT abdomen and pelvis exam 07/18/2021.

HISTORY: Malignant neuroendocrine tumors.
TECHNIQUE: Coronal and axial noncontrast and contrast MRI abdomen study performed without and with 20 mL of shared ProHance.

[Series 14: t2_cor · coronal · 6.0mm · 1.34mm/px · 1 of 32 slices shown]
[im 1/32]
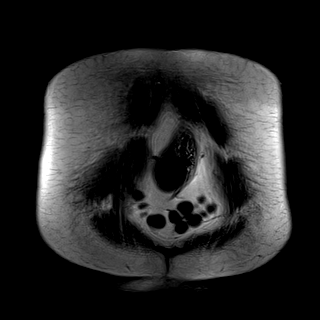

[Series 15: t2_axial_fs_bh · axial · 6.0mm · 1.41mm/px · z∈[-282,-59]mm · 2 of 32 slices shown]
[im 1/32]
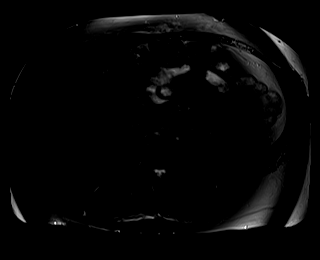
[im 32/32]
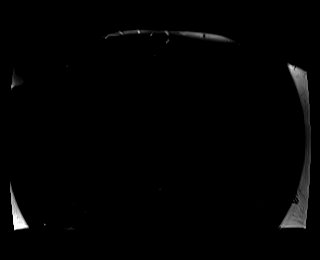

[Series 16: t1_vibe_axial_(person_name)__pre in-out_opp · axial · 3.0mm · 1.76mm/px · z∈[-300,-63]mm · 5 of 80 slices shown]
[im 1/80]
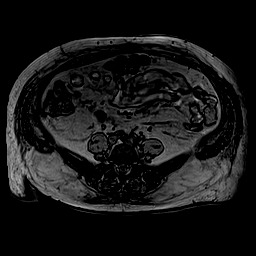
[im 20/80]
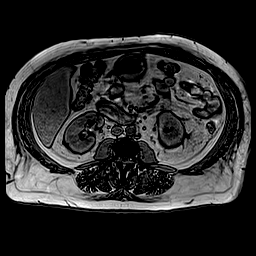
[im 40/80]
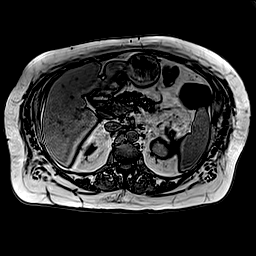
[im 60/80]
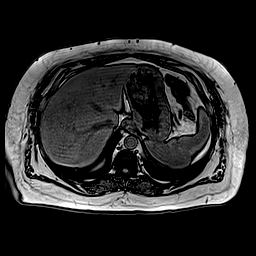
[im 80/80]
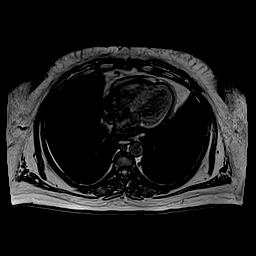

[Series 17: t1_vibe_axial_(person_name)__pre in-out_in · axial · 3.0mm · 1.76mm/px · z∈[-300,-63]mm · 5 of 80 slices shown]
[im 1/80]
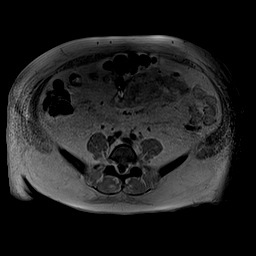
[im 20/80]
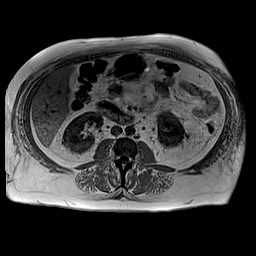
[im 40/80]
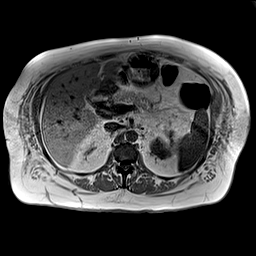
[im 60/80]
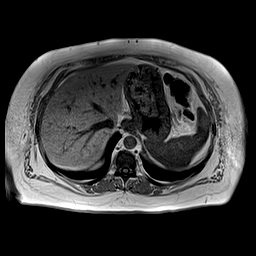
[im 80/80]
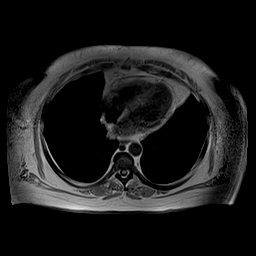

[Series 19: t1_vibe_axial_(person_name)__pre in-out_w · axial · 3.0mm · 1.76mm/px · z∈[-300,-63]mm · 5 of 80 slices shown]
[im 1/80]
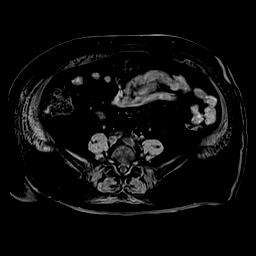
[im 20/80]
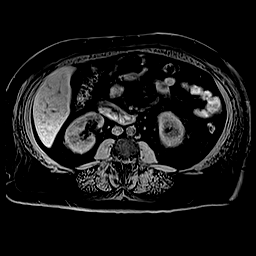
[im 40/80]
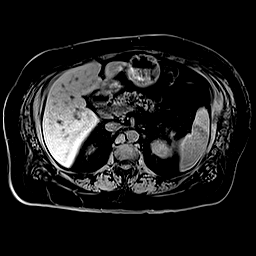
[im 60/80]
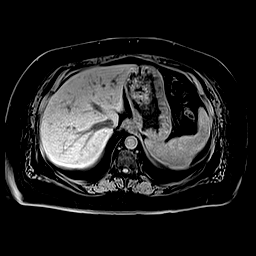
[im 80/80]
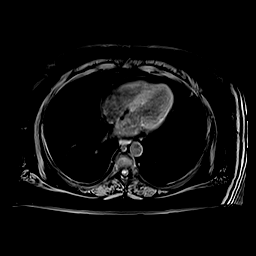

[Series 26: t1_vibe_axial_(person_name)_+c1_w · axial · 3.0mm · 1.76mm/px · z∈[-300,-63]mm · 5 of 80 slices shown]
[im 1/80]
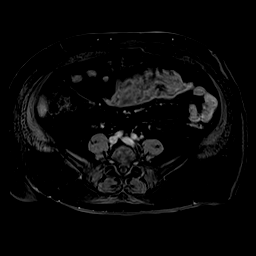
[im 20/80]
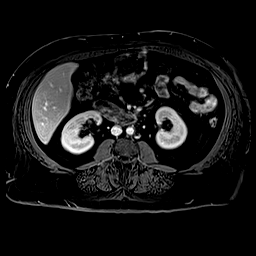
[im 40/80]
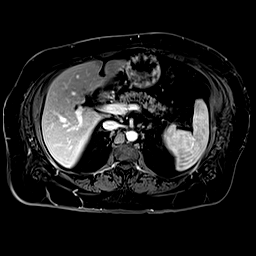
[im 60/80]
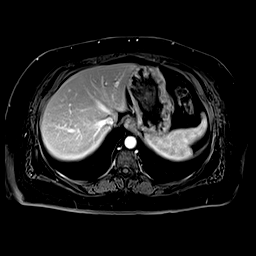
[im 80/80]
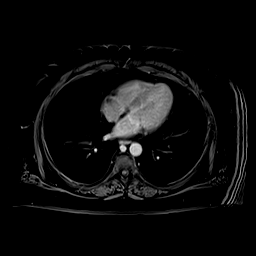

[Series 27: t1_vibe_axial_(person_name)_+c1_w_sub · axial · 3.0mm · 1.76mm/px · z∈[-300,-63]mm · 5 of 80 slices shown]
[im 1/80]
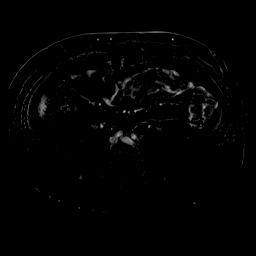
[im 20/80]
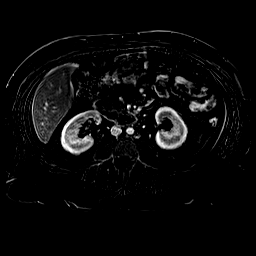
[im 40/80]
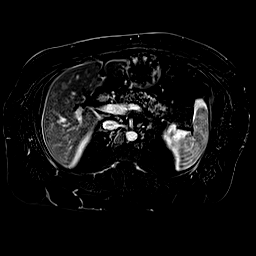
[im 60/80]
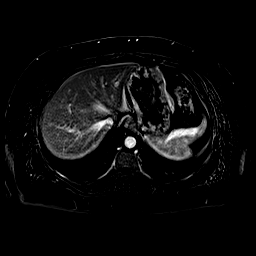
[im 80/80]
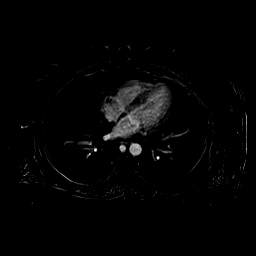

[Series 34: t1_vibe_axial_(person_name)_+c2_w · axial · 3.0mm · 1.76mm/px · z∈[-300,-63]mm · 5 of 80 slices shown]
[im 1/80]
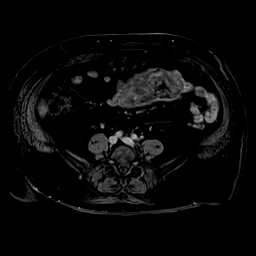
[im 20/80]
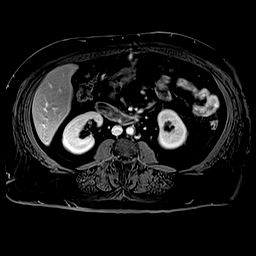
[im 40/80]
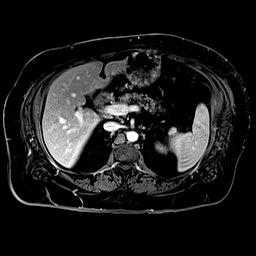
[im 60/80]
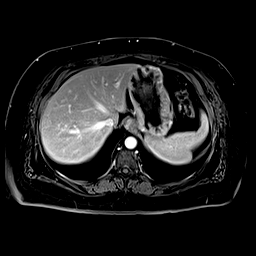
[im 80/80]
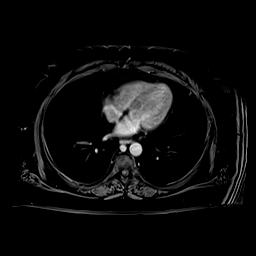

[Series 35: t1_vibe_axial_(person_name)_+c2_w_sub · axial · 3.0mm · 1.76mm/px · z∈[-300,-63]mm · 5 of 80 slices shown]
[im 1/80]
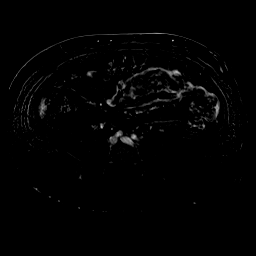
[im 20/80]
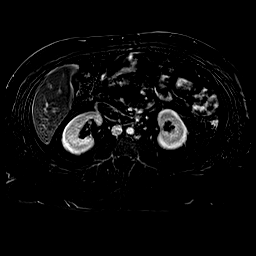
[im 40/80]
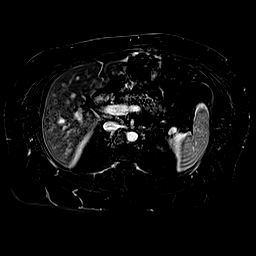
[im 60/80]
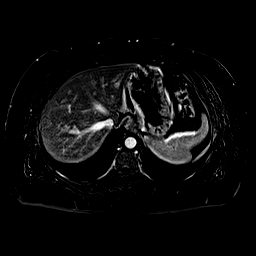
[im 80/80]
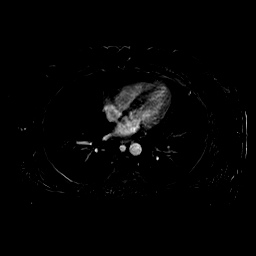

[Series 42: t1_vibe_axial_(person_name)_+c3_w · axial · 3.0mm · 1.76mm/px · z∈[-300,-63]mm · 5 of 80 slices shown]
[im 1/80]
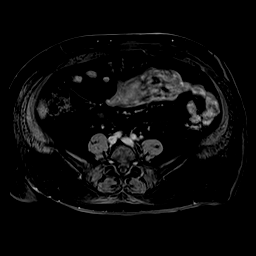
[im 20/80]
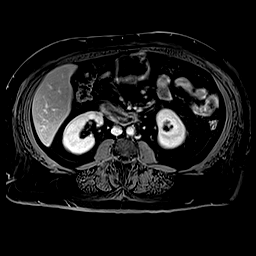
[im 40/80]
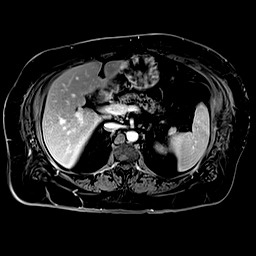
[im 60/80]
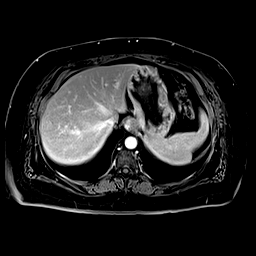
[im 80/80]
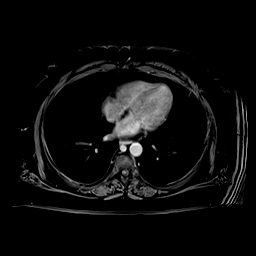

[Series 43: t1_vibe_axial_(person_name)_+c3_w_sub · axial · 3.0mm · 1.76mm/px · z∈[-300,-63]mm · 5 of 80 slices shown]
[im 1/80]
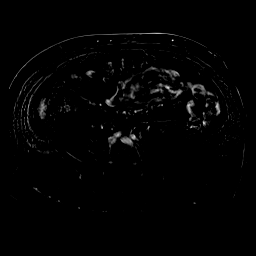
[im 20/80]
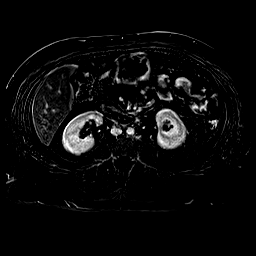
[im 40/80]
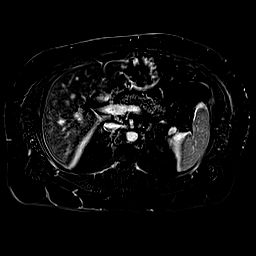
[im 60/80]
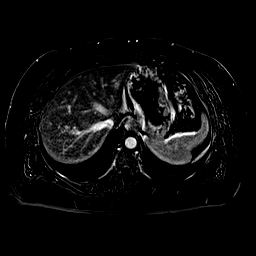
[im 80/80]
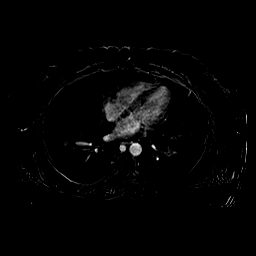

[48 of 48 positions shown; findings below may reference images not displayed]

FINDINGS: No mass lesion of lung bases identified.

Heart size is normal. There is no pericardial effusion. Atherosclerotic irregularity of abdominal aorta is seen without aneurysm. Inferior vena cava is normal.

Solid mass lesion with relative smooth margin with mild enhancement just cephalad to the pancreatic head identified, up to 5.2 x 3.2 cm, with minimal mass effect upon cephalad portion of pancreatic head and pancreatic body.

The rest of the pancreas and pancreatic duct are normal.

Hepatomegaly versus Riedel's lobe of liver with inferior tip of right hepatic lobe extending beyond inferior pole of right kidney is seen. Liver is otherwise normal. Gallbladder is partially collapsed. Spleen, adrenal glands and kidneys are normal.

Distal esophagus, stomach and bowel loops are normal.

Fat stranding at mesenteric root with short axis less than 1 cm lymph nodes are seen. No other enlarged retroperitoneal lymph node is seen.

Mild degenerative changes of spine are seen.

Visualized breast tissues are normal. Increased subcutaneous adipose tissue is seen.

There is no ascites.
IMPRESSION: 1.
Solid mass lesion just cephalad to the pancreatic head again identified, possibly due to neuroendocrine tumor, less likely isolated enlarged lymph node or other etiology. CT/PET study and tissue correlation may provide more definitive diagnosis.

2.
There may be mild mesenteric panniculitis.

3.
Additional chronic findings as noted.

## 2021-08-31 IMAGING — MR MRI FEMALE PELVIS WO/W CONTRAST
21 series · 48 of 48 positions shown · IV contrast (20cc prohance)
Comparison: none

[Series 4: t2_cor_haste · coronal · 6.0mm · 1.19mm/px · 1 of 26 slices shown]
[im 1/26]
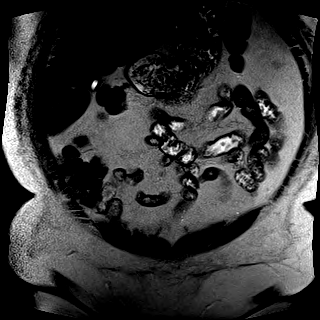

[Series 5: t2_sag · sagittal · 3.0mm · 0.69mm/px · 1 of 36 slices shown]
[im 1/36]
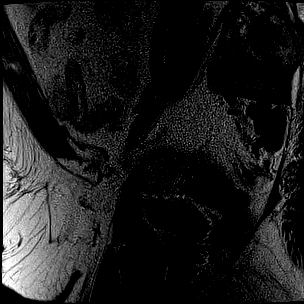

[Series 6: t2_cor_obl · coronal · 3.0mm · 0.62mm/px · 1 of 30 slices shown]
[im 1/30]
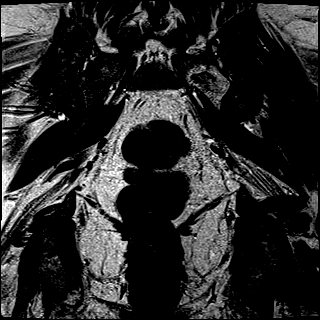

[Series 7: t2_axial_obl · axial · 3.0mm · 0.31mm/px · 1 of 38 slices shown]
[im 1/38]
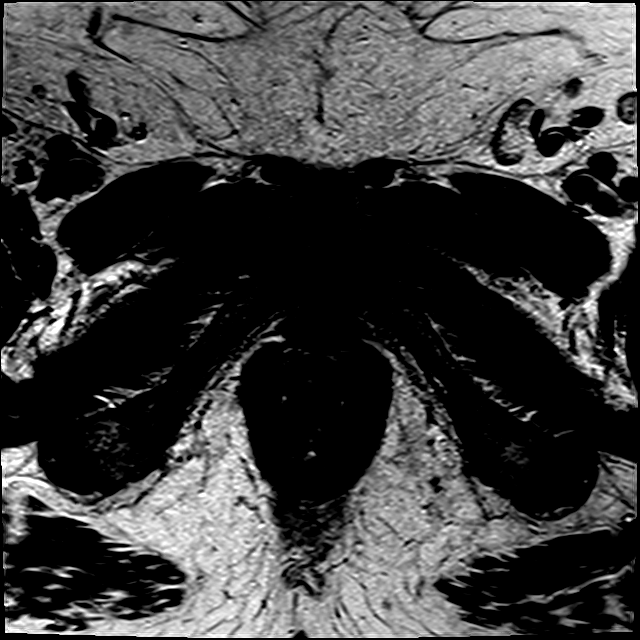

[Series 8: dwi_cor_obl_tracew · coronal · 4.0mm · 0.90mm/px · 1 of 20 slices shown]
[im 1/20]
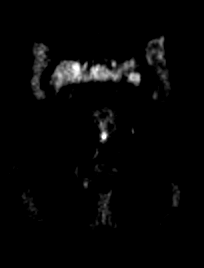

[Series 9: dwi_cor_obl_adc · coronal · 4.0mm · 0.90mm/px · 1 of 20 slices shown]
[im 1/20]
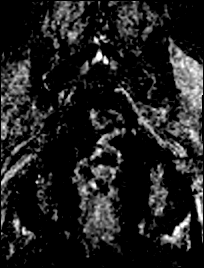

[Series 10: t1_vibe_axial_(person_name)_(person_name) · axial · 3.0mm · 1.19mm/px · z∈[-484,-319]mm · 2 of 56 slices shown (1 of 2)]
[im 1/56]
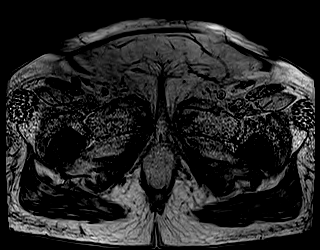
[im 56/56]
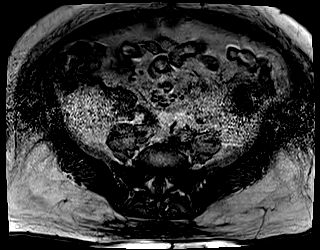

[Series 11: t1_vibe_axial_(person_name)_in · axial · 3.0mm · 1.19mm/px · z∈[-484,-319]mm · 2 of 56 slices shown]
[im 1/56]
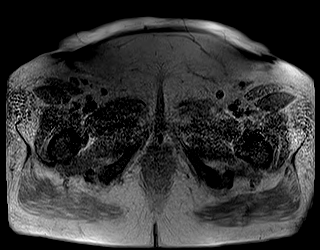
[im 56/56]
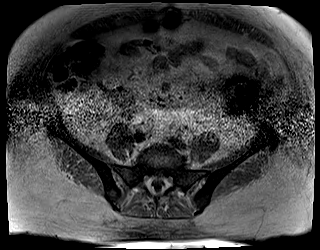

[Series 13: t1_vibe_axial_(person_name)_(person_name) · axial · 3.0mm · 1.19mm/px · z∈[-484,-319]mm · 2 of 56 slices shown (2 of 2)]
[im 1/56]
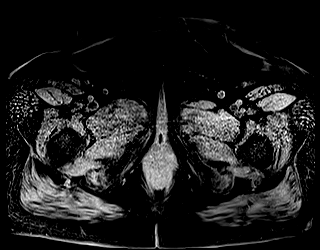
[im 56/56]
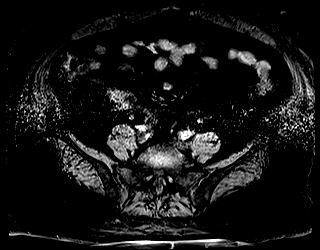

[Series 45: t1_vibe_axial_(person_name)_+c1_opp · axial · 3.0mm · 1.19mm/px · z∈[-484,-319]mm · 3 of 56 slices shown]
[im 1/56]
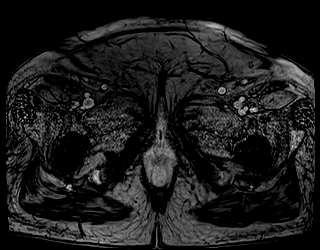
[im 28/56]
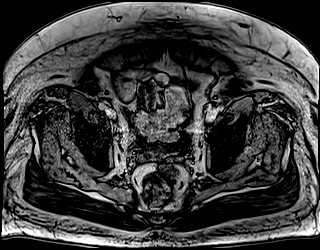
[im 56/56]
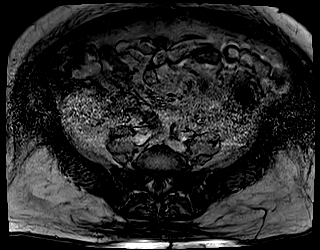

[Series 46: t1_vibe_axial_(person_name)_+c1_opp_sub · axial · 3.0mm · 1.19mm/px · z∈[-484,-319]mm · 3 of 56 slices shown]
[im 1/56]
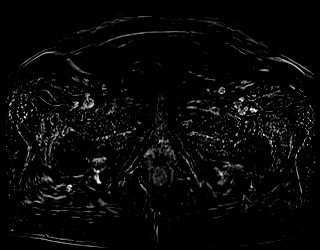
[im 28/56]
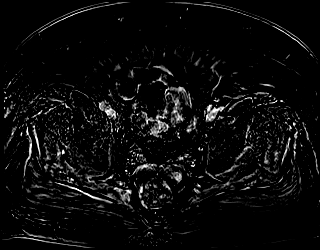
[im 56/56]
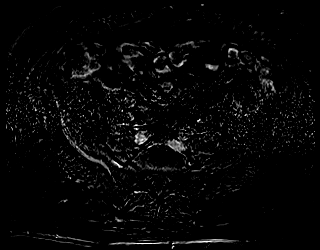

[Series 47: t1_vibe_axial_(person_name)_+c1_in · axial · 3.0mm · 1.19mm/px · z∈[-484,-319]mm · 3 of 56 slices shown]
[im 1/56]
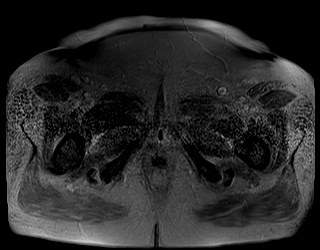
[im 28/56]
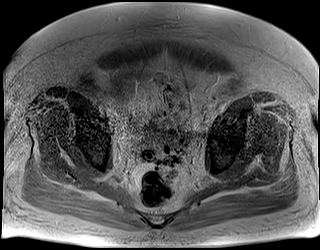
[im 56/56]
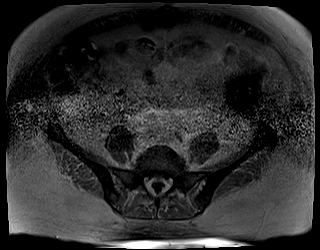

[Series 48: t1_vibe_axial_(person_name)_+c1_in_sub · axial · 3.0mm · 1.19mm/px · z∈[-484,-319]mm · 3 of 56 slices shown]
[im 1/56]
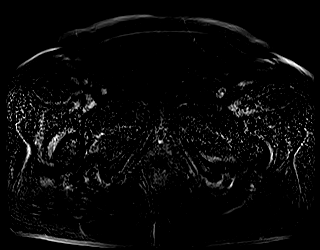
[im 28/56]
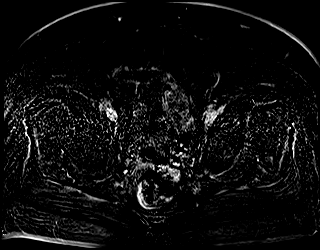
[im 56/56]
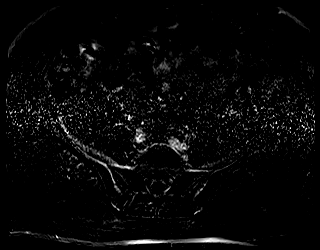

[Series 49: t1_vibe_axial_(person_name)_+c1_f · axial · 3.0mm · 1.19mm/px · z∈[-484,-319]mm · 3 of 56 slices shown]
[im 1/56]
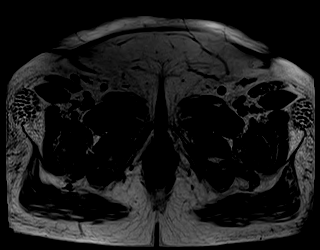
[im 28/56]
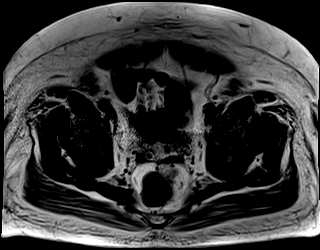
[im 56/56]
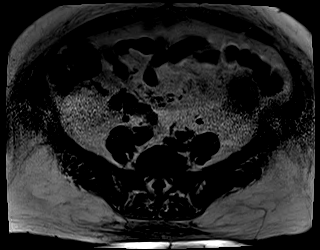

[Series 50: t1_vibe_axial_(person_name)_+c1_f_sub · axial · 3.0mm · 1.19mm/px · z∈[-484,-319]mm · 3 of 56 slices shown]
[im 1/56]
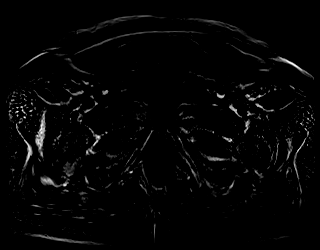
[im 28/56]
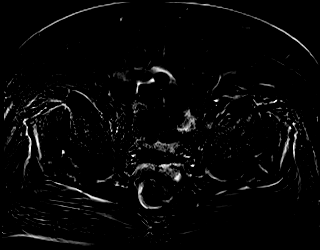
[im 56/56]
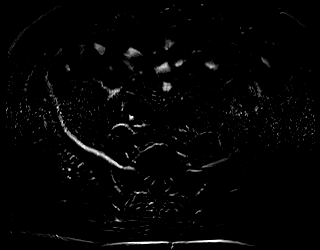

[Series 51: t1_vibe_axial_(person_name)_+c1_w · axial · 3.0mm · 1.19mm/px · z∈[-484,-319]mm · 3 of 56 slices shown]
[im 1/56]
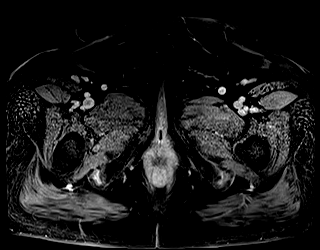
[im 28/56]
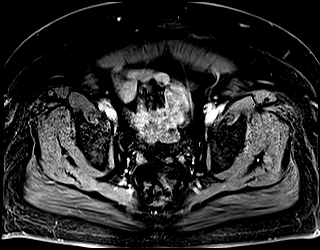
[im 56/56]
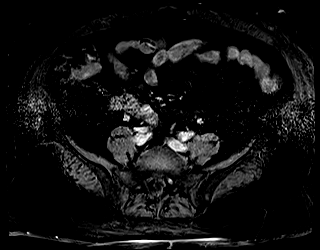

[Series 52: t1_vibe_axial_(person_name)_+c1_w_sub · axial · 3.0mm · 1.19mm/px · z∈[-484,-319]mm · 3 of 56 slices shown]
[im 1/56]
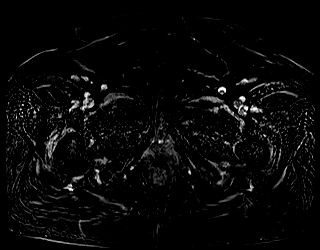
[im 28/56]
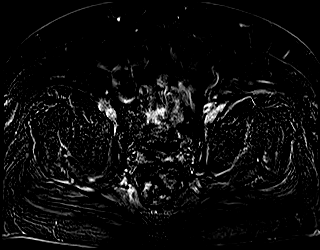
[im 56/56]
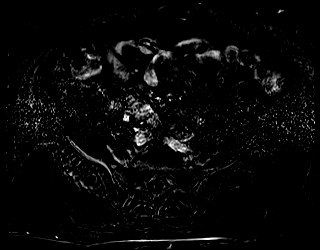

[Series 53: t1_vibe_sag_(person_name)_+c2_opp · sagittal · 3.0mm · 1.03mm/px · 3 of 64 slices shown]
[im 1/64]
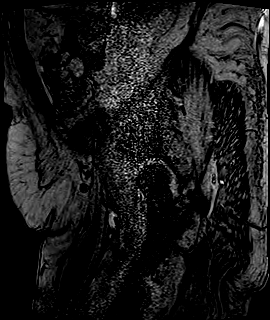
[im 32/64]
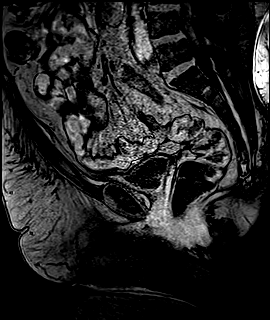
[im 64/64]
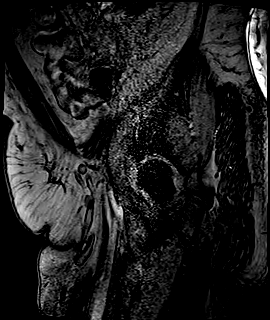

[Series 54: t1_vibe_sag_(person_name)_+c2_in · sagittal · 3.0mm · 1.03mm/px · 3 of 64 slices shown]
[im 1/64]
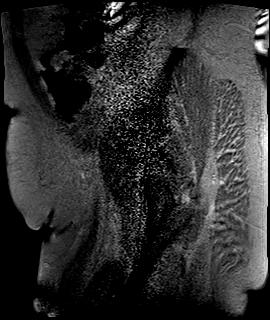
[im 32/64]
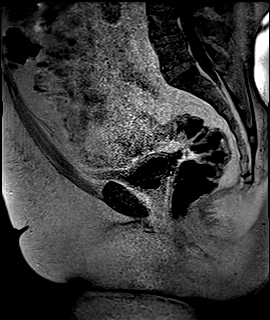
[im 64/64]
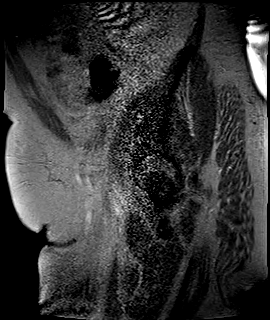

[Series 55: t1_vibe_sag_(person_name)_+c2_f · sagittal · 3.0mm · 1.03mm/px · 3 of 64 slices shown]
[im 1/64]
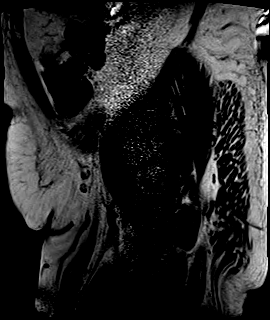
[im 32/64]
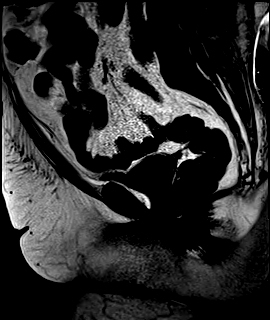
[im 64/64]
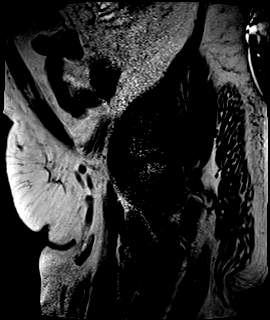

[Series 56: t1_vibe_sag_(person_name)_+c2_w · sagittal · 3.0mm · 1.03mm/px · 3 of 64 slices shown]
[im 1/64]
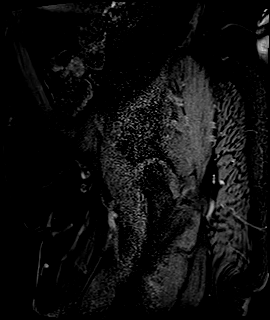
[im 32/64]
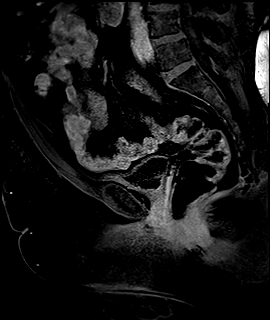
[im 64/64]
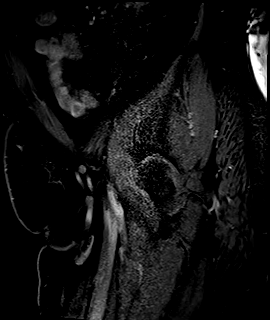

[48 of 48 positions shown; findings below may reference images not displayed]

REASON FOR EXAM

*
Malignant neuroendocrine tumors

COMPARISON

*
CT pelvis 07/20/2021

TECHNIQUE

*
MR images of the pelvis were acquired before and after the administration of 20 mL ProHance IV contrast

FINDINGS

Uterus

*
Surgically absent

Cervix

*
Surgically absent

Right Ovary/Adnexa

*
Normal

Left Ovary/Adnexa

*
Normal

Pelvic Viscera

*
Mild colonic diverticulosis

*
No ascites

*
No obstruction of the visualized bowel

*
No signs of peritoneal implants

Lymph Nodes

*
No suspicious lymph nodes

Other

*
No malignant osseous enhancement

IMPRESSION

*
No metastatic disease in the pelvis

## 2023-04-21 IMAGING — US US RENAL W/PRE  and  POST VOID BLADDER
1 series · 14 of 25 positions shown · non-contrast
Comparison: None

Images Obtained from [facility]
HISTORY: Hematuria
TECHNIQUE: Multiple images from a real-time examination of the retroperitoneum were evaluated.

[Series 1: us renal w/pre and post void bladder · non-contrast · 14 of 30 slices shown]
[im 1/30]
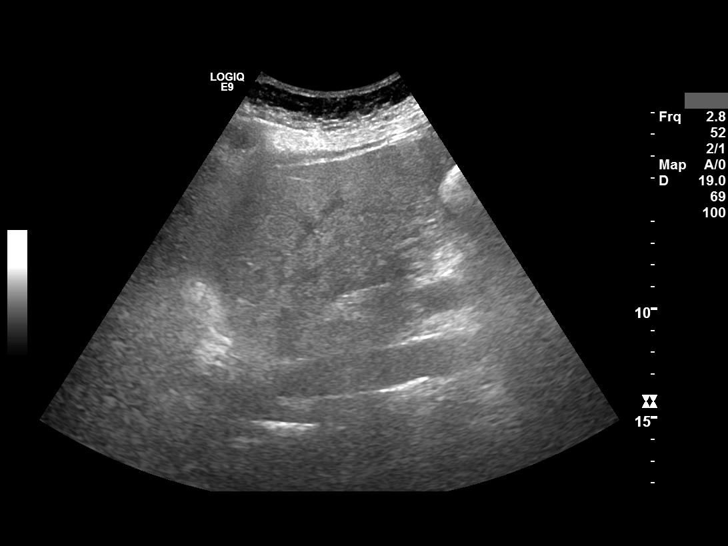
[im 3/30]
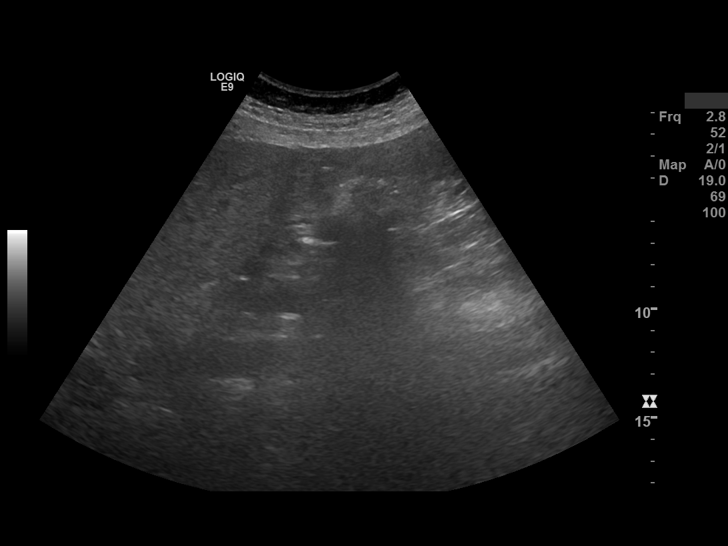
[im 5/30]
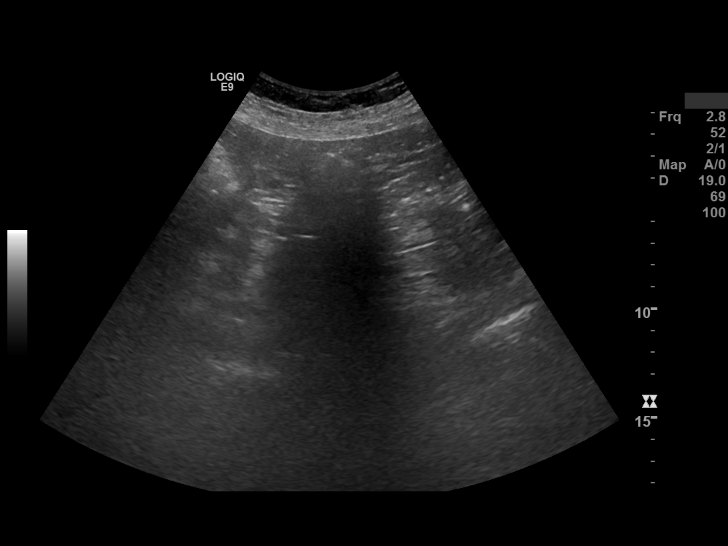
[im 8/30]
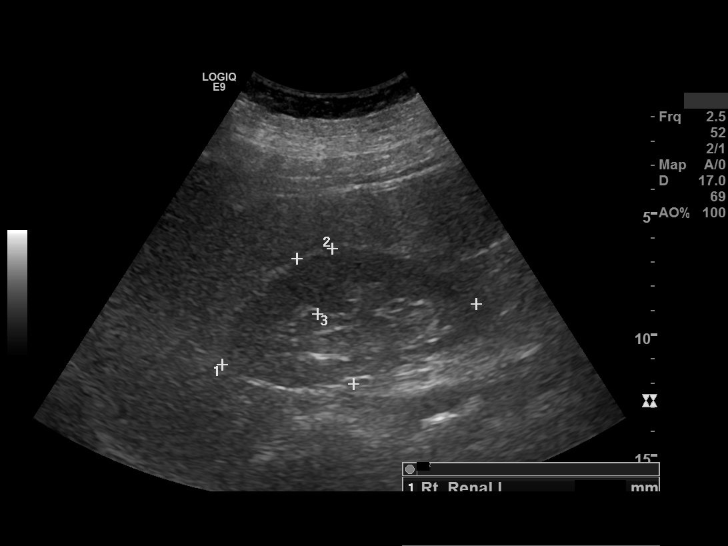
[im 10/30]
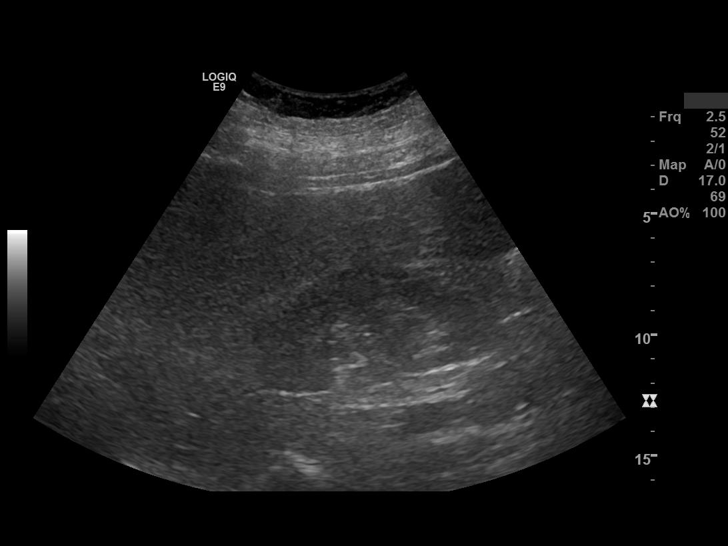
[im 11/30]
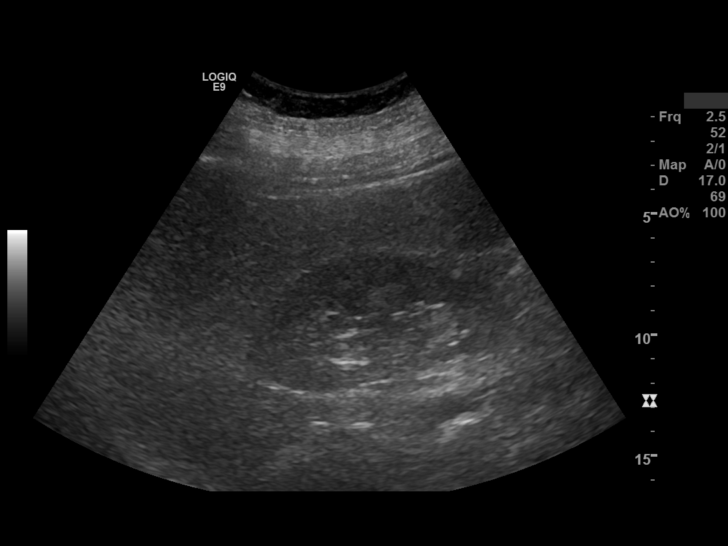
[im 14/30]
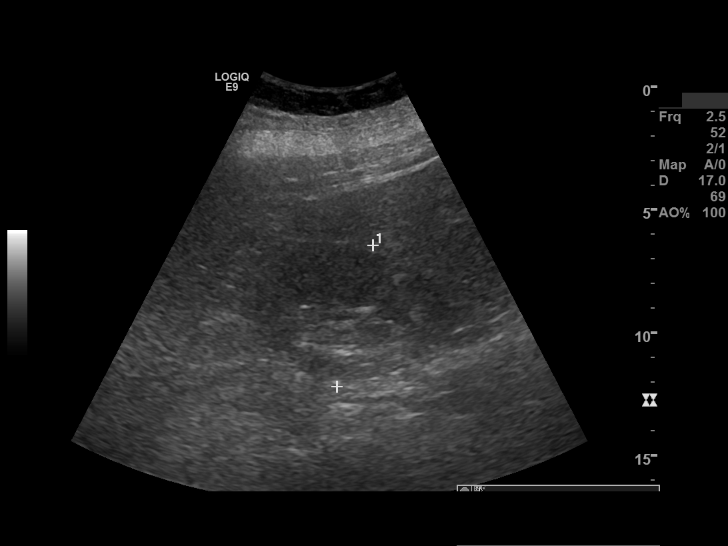
[im 16/30]
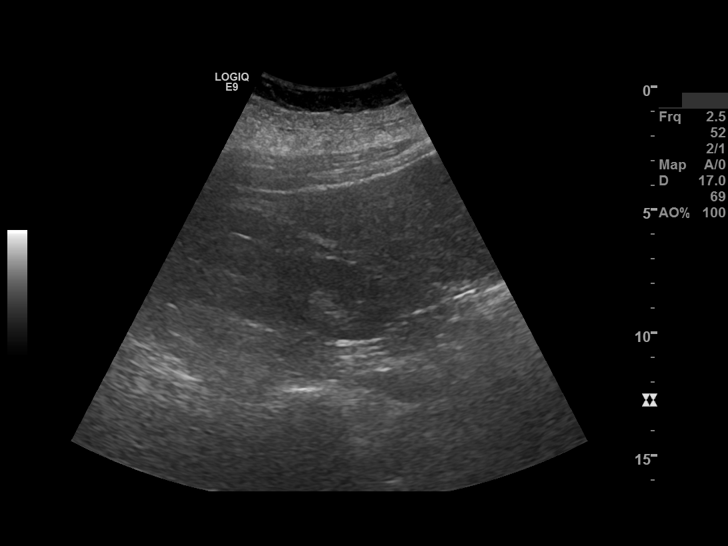
[im 19/30]
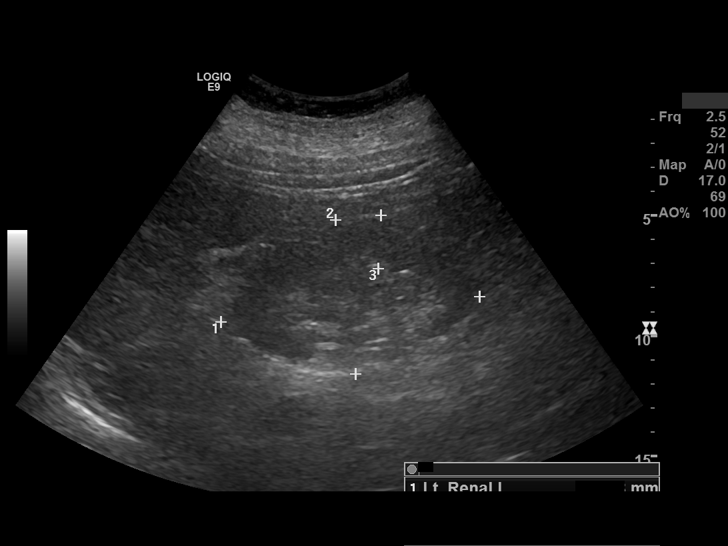
[im 20/30]
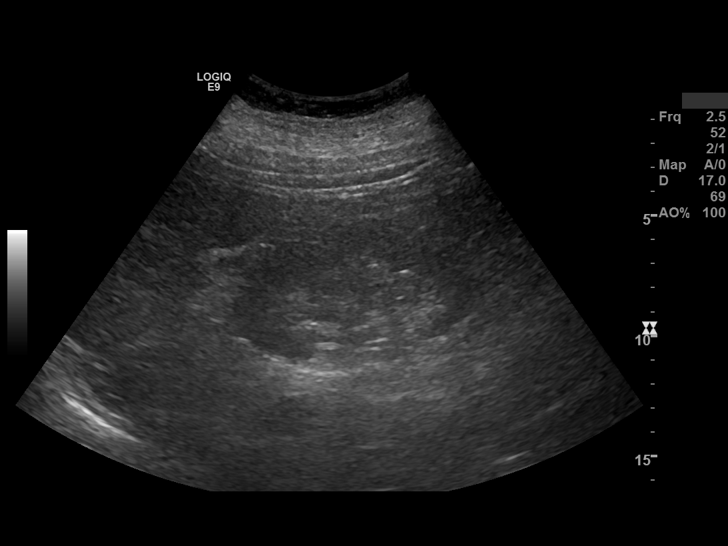
[im 22/30]
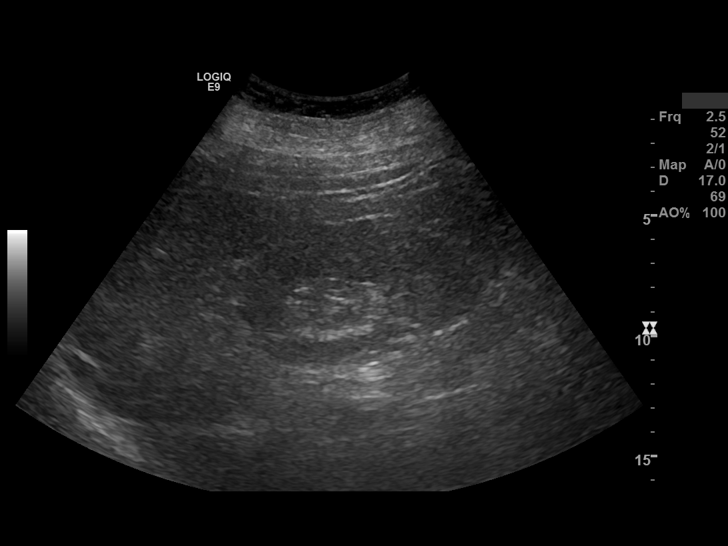
[im 25/30]
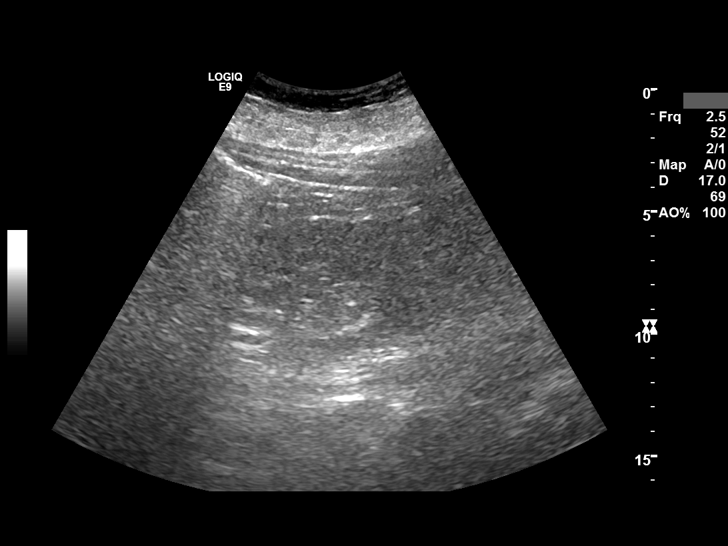
[im 27/30]
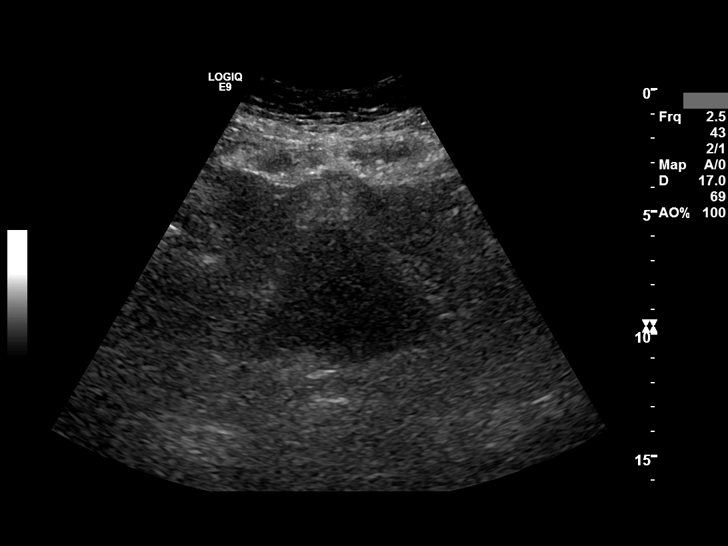
[im 30/30]
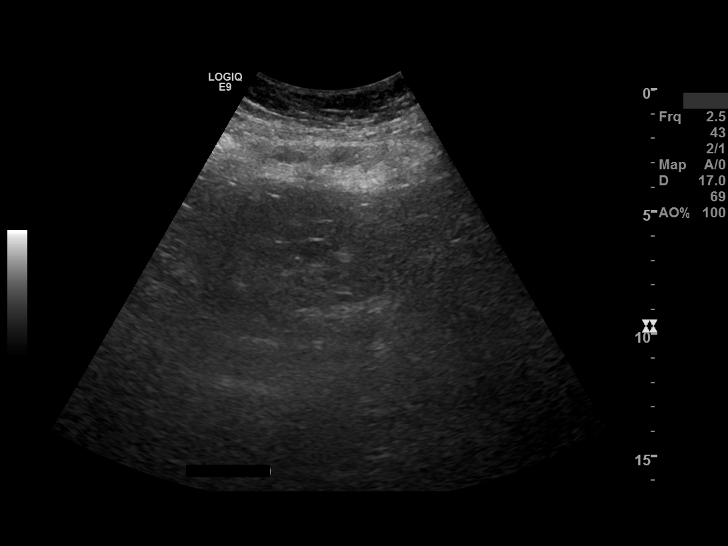

[14 of 25 positions shown; findings below may reference images not displayed]

FINDINGS: The right kidney measures 57 x 108 x 59 mm with renal cortex averaging 24 mm in diameter.  The left kidney measures 65 x 108 x 69 mm with renal cortex averaging 22 mm in diameter.
No hydronephrosis.  No nephrolithiasis. No renal mass.
Urinary bladder is unremarkable.
Prevoid urinary bladder volume is 114 mL. Postvoid urinary bladder volume is 0 mL.
Aorta: Unremarkable
IVC: Unremarkable
Origin of common iliac arteries: Not visible
IMPRESSION: Unremarkable renal retroperitoneum ultrasound. Post void bladder volume 0 mL.
